# Patient Record
Sex: Male | Born: 1955
Health system: Southern US, Community
[De-identification: ages and names within clinical notes are randomized; demographics above are authoritative.]

## PROBLEM LIST (undated history)

## (undated) DIAGNOSIS — R0609 Other forms of dyspnea: Secondary | ICD-10-CM

## (undated) DIAGNOSIS — Z8619 Personal history of other infectious and parasitic diseases: Secondary | ICD-10-CM

## (undated) DIAGNOSIS — E785 Hyperlipidemia, unspecified: Secondary | ICD-10-CM

## (undated) DIAGNOSIS — R0600 Dyspnea, unspecified: Secondary | ICD-10-CM

## (undated) DIAGNOSIS — L709 Acne, unspecified: Secondary | ICD-10-CM

## (undated) DIAGNOSIS — Z8719 Personal history of other diseases of the digestive system: Secondary | ICD-10-CM

## (undated) DIAGNOSIS — J449 Chronic obstructive pulmonary disease, unspecified: Secondary | ICD-10-CM

## (undated) DIAGNOSIS — K219 Gastro-esophageal reflux disease without esophagitis: Secondary | ICD-10-CM

## (undated) DIAGNOSIS — N401 Enlarged prostate with lower urinary tract symptoms: Secondary | ICD-10-CM

## (undated) DIAGNOSIS — Z87898 Personal history of other specified conditions: Secondary | ICD-10-CM

## (undated) DIAGNOSIS — E119 Type 2 diabetes mellitus without complications: Secondary | ICD-10-CM

## (undated) DIAGNOSIS — M545 Low back pain, unspecified: Secondary | ICD-10-CM

## (undated) DIAGNOSIS — I10 Essential (primary) hypertension: Secondary | ICD-10-CM

## (undated) DIAGNOSIS — M1990 Unspecified osteoarthritis, unspecified site: Secondary | ICD-10-CM

## (undated) DIAGNOSIS — G8929 Other chronic pain: Secondary | ICD-10-CM

## (undated) DIAGNOSIS — N489 Disorder of penis, unspecified: Secondary | ICD-10-CM

## (undated) DIAGNOSIS — Q7649 Other congenital malformations of spine, not associated with scoliosis: Secondary | ICD-10-CM

## (undated) DIAGNOSIS — Z9889 Other specified postprocedural states: Secondary | ICD-10-CM

## (undated) HISTORY — PX: ANKLE SURGERY: SHX546

## (undated) HISTORY — PX: LAPAROSCOPIC CHOLECYSTECTOMY: SUR755

## (undated) HISTORY — PX: ROTATOR CUFF REPAIR: SHX139

## (undated) HISTORY — PX: OTHER SURGICAL HISTORY: SHX169

---

## 1977-02-20 HISTORY — PX: WRIST SURGERY: SHX841

## 1997-06-05 ENCOUNTER — Other Ambulatory Visit: Admission: RE | Admit: 1997-06-05 | Discharge: 1997-06-05 | Payer: Self-pay | Admitting: Gastroenterology

## 1997-09-15 ENCOUNTER — Ambulatory Visit: Admission: RE | Admit: 1997-09-15 | Discharge: 1997-09-15 | Payer: Self-pay | Admitting: Pulmonary Disease

## 1997-09-21 ENCOUNTER — Ambulatory Visit (HOSPITAL_COMMUNITY): Admission: RE | Admit: 1997-09-21 | Discharge: 1997-09-21 | Payer: Self-pay | Admitting: Pulmonary Disease

## 1997-11-23 ENCOUNTER — Ambulatory Visit: Admission: RE | Admit: 1997-11-23 | Discharge: 1997-11-23 | Payer: Self-pay | Admitting: Pulmonary Disease

## 1998-03-09 ENCOUNTER — Ambulatory Visit: Admission: RE | Admit: 1998-03-09 | Discharge: 1998-03-09 | Payer: Self-pay | Admitting: Pulmonary Disease

## 1998-06-24 ENCOUNTER — Ambulatory Visit: Admission: RE | Admit: 1998-06-24 | Discharge: 1998-06-24 | Payer: Self-pay | Admitting: Pulmonary Disease

## 2000-11-09 ENCOUNTER — Ambulatory Visit (HOSPITAL_COMMUNITY): Admission: RE | Admit: 2000-11-09 | Discharge: 2000-11-09 | Payer: Self-pay | Admitting: Gastroenterology

## 2000-11-09 ENCOUNTER — Encounter: Payer: Self-pay | Admitting: Gastroenterology

## 2003-02-21 HISTORY — PX: ANTERIOR CERVICAL DECOMP/DISCECTOMY FUSION: SHX1161

## 2003-11-19 ENCOUNTER — Encounter: Admission: RE | Admit: 2003-11-19 | Discharge: 2003-11-19 | Payer: Self-pay | Admitting: Family Medicine

## 2003-11-30 ENCOUNTER — Ambulatory Visit (HOSPITAL_COMMUNITY): Admission: RE | Admit: 2003-11-30 | Discharge: 2003-12-01 | Payer: Self-pay | Admitting: Neurosurgery

## 2004-09-17 ENCOUNTER — Encounter: Admission: RE | Admit: 2004-09-17 | Discharge: 2004-09-17 | Payer: Self-pay | Admitting: Neurosurgery

## 2004-10-26 ENCOUNTER — Ambulatory Visit (HOSPITAL_COMMUNITY): Admission: RE | Admit: 2004-10-26 | Discharge: 2004-10-26 | Payer: Self-pay | Admitting: Orthopedic Surgery

## 2004-10-26 ENCOUNTER — Ambulatory Visit (HOSPITAL_BASED_OUTPATIENT_CLINIC_OR_DEPARTMENT_OTHER): Admission: RE | Admit: 2004-10-26 | Discharge: 2004-10-26 | Payer: Self-pay | Admitting: Orthopedic Surgery

## 2012-01-24 ENCOUNTER — Other Ambulatory Visit: Payer: Self-pay | Admitting: Family Medicine

## 2012-01-24 DIAGNOSIS — H539 Unspecified visual disturbance: Secondary | ICD-10-CM

## 2012-01-24 DIAGNOSIS — H53122 Transient visual loss, left eye: Secondary | ICD-10-CM

## 2012-01-25 ENCOUNTER — Ambulatory Visit
Admission: RE | Admit: 2012-01-25 | Discharge: 2012-01-25 | Disposition: A | Discharge status: Home / Self Care | Payer: PRIVATE HEALTH INSURANCE | Source: Ambulatory Visit | Attending: Family Medicine | Admitting: Family Medicine

## 2012-01-25 DIAGNOSIS — H53122 Transient visual loss, left eye: Secondary | ICD-10-CM

## 2012-01-25 DIAGNOSIS — H539 Unspecified visual disturbance: Secondary | ICD-10-CM

## 2012-02-26 ENCOUNTER — Other Ambulatory Visit: Payer: Self-pay | Admitting: Family Medicine

## 2012-02-29 ENCOUNTER — Ambulatory Visit
Admission: RE | Admit: 2012-02-29 | Discharge: 2012-02-29 | Disposition: A | Discharge status: Home / Self Care | Payer: 59 | Source: Ambulatory Visit | Attending: Family Medicine | Admitting: Family Medicine

## 2012-02-29 ENCOUNTER — Ambulatory Visit
Admission: RE | Admit: 2012-02-29 | Discharge: 2012-02-29 | Disposition: A | Discharge status: Home / Self Care | Payer: Self-pay | Source: Ambulatory Visit | Attending: Family Medicine | Admitting: Family Medicine

## 2012-02-29 MED ORDER — GADOBENATE DIMEGLUMINE 529 MG/ML IV SOLN
18.0000 mL | Freq: Once | INTRAVENOUS | Status: AC | PRN
  Administered 2012-02-29: 18 mL via INTRAVENOUS

## 2012-03-08 ENCOUNTER — Other Ambulatory Visit (HOSPITAL_COMMUNITY): Payer: Self-pay | Admitting: Interventional Radiology

## 2012-03-08 ENCOUNTER — Ambulatory Visit (HOSPITAL_COMMUNITY)
Admission: RE | Admit: 2012-03-08 | Discharge: 2012-03-08 | Disposition: A | Discharge status: Home / Self Care | Payer: 59 | Source: Ambulatory Visit | Attending: Interventional Radiology | Admitting: Interventional Radiology

## 2012-03-08 DIAGNOSIS — I729 Aneurysm of unspecified site: Secondary | ICD-10-CM

## 2012-03-12 ENCOUNTER — Encounter (HOSPITAL_COMMUNITY): Payer: Self-pay

## 2012-03-12 ENCOUNTER — Ambulatory Visit (HOSPITAL_COMMUNITY)
Admission: RE | Admit: 2012-03-12 | Discharge: 2012-03-12 | Disposition: A | Discharge status: Home / Self Care | Payer: 59 | Source: Ambulatory Visit | Attending: Interventional Radiology | Admitting: Interventional Radiology

## 2012-03-12 ENCOUNTER — Other Ambulatory Visit (HOSPITAL_COMMUNITY): Payer: Self-pay | Admitting: Interventional Radiology

## 2012-03-12 ENCOUNTER — Encounter (HOSPITAL_COMMUNITY): Payer: Self-pay | Admitting: Pharmacy Technician

## 2012-03-12 DIAGNOSIS — I671 Cerebral aneurysm, nonruptured: Secondary | ICD-10-CM | POA: Insufficient documentation

## 2012-03-12 DIAGNOSIS — R51 Headache: Secondary | ICD-10-CM | POA: Insufficient documentation

## 2012-03-12 DIAGNOSIS — I10 Essential (primary) hypertension: Secondary | ICD-10-CM | POA: Insufficient documentation

## 2012-03-12 DIAGNOSIS — R519 Headache, unspecified: Secondary | ICD-10-CM | POA: Insufficient documentation

## 2012-03-12 DIAGNOSIS — I729 Aneurysm of unspecified site: Secondary | ICD-10-CM

## 2012-03-12 DIAGNOSIS — H538 Other visual disturbances: Secondary | ICD-10-CM | POA: Insufficient documentation

## 2012-03-12 HISTORY — DX: Essential (primary) hypertension: I10

## 2012-03-12 LAB — BASIC METABOLIC PANEL
BUN: 20 mg/dL (ref 6–23)
CO2: 25 mEq/L (ref 19–32)
Calcium: 10 mg/dL (ref 8.4–10.5)
Glucose, Bld: 251 mg/dL — ABNORMAL HIGH (ref 70–99)
Potassium: 3.9 mEq/L (ref 3.5–5.1)

## 2012-03-12 LAB — CBC WITH DIFFERENTIAL/PLATELET
Basophils Absolute: 0 10*3/uL (ref 0.0–0.1)
Basophils Relative: 1 % (ref 0–1)
HCT: 45.6 % (ref 39.0–52.0)
Hemoglobin: 16.5 g/dL (ref 13.0–17.0)
Lymphs Abs: 1.5 10*3/uL (ref 0.7–4.0)
MCH: 31.4 pg (ref 26.0–34.0)
MCHC: 36.2 g/dL — ABNORMAL HIGH (ref 30.0–36.0)
MCV: 86.7 fL (ref 78.0–100.0)
Monocytes Absolute: 0.5 10*3/uL (ref 0.1–1.0)
Neutrophils Relative %: 59 % (ref 43–77)
RBC: 5.26 MIL/uL (ref 4.22–5.81)
RDW: 12.1 % (ref 11.5–15.5)
WBC: 5.7 10*3/uL (ref 4.0–10.5)

## 2012-03-12 MED ORDER — FENTANYL CITRATE 0.05 MG/ML IJ SOLN
INTRAMUSCULAR | Status: AC
  Filled 2012-03-12: qty 4

## 2012-03-12 MED ORDER — MIDAZOLAM HCL 2 MG/2ML IJ SOLN
INTRAMUSCULAR | Status: AC | PRN
  Administered 2012-03-12 (×2): 1 mg via INTRAVENOUS

## 2012-03-12 MED ORDER — HEPARIN SOD (PORK) LOCK FLUSH 100 UNIT/ML IV SOLN
INTRAVENOUS | Status: AC | PRN
  Administered 2012-03-12: 1000 [IU] via INTRAVENOUS

## 2012-03-12 MED ORDER — MIDAZOLAM HCL 2 MG/2ML IJ SOLN
INTRAMUSCULAR | Status: AC
  Filled 2012-03-12: qty 4

## 2012-03-12 MED ORDER — IOHEXOL 300 MG/ML  SOLN
150.0000 mL | Freq: Once | INTRAMUSCULAR | Status: AC | PRN
  Administered 2012-03-12: 80 mL via INTRAVENOUS

## 2012-03-12 MED ORDER — FENTANYL CITRATE 0.05 MG/ML IJ SOLN
INTRAMUSCULAR | Status: AC | PRN
  Administered 2012-03-12 (×2): 25 ug via INTRAVENOUS

## 2012-03-12 MED ORDER — SODIUM CHLORIDE 0.9 % IV SOLN
Freq: Once | INTRAVENOUS | Status: AC
  Administered 2012-03-12: 12:00:00 via INTRAVENOUS

## 2012-03-12 NOTE — H&P (Signed)
Tommy Rojas is an 57 y.o. male.   Chief Complaint: headache x 1 mo occas dizziness Recent MRI/MRA reveals possible aneurysm Scheduled now for cerebral arteriogram HPI: HTN  Past Medical History  Diagnosis Date  . Hypertension   . Headache     History reviewed. No pertinent past surgical history.  History reviewed. No pertinent family history. Social History:  reports that he quit smoking about 2 years ago. He does not have any smokeless tobacco history on file. His alcohol and drug histories not on file.  Allergies: No Known Allergies   (Not in a hospital admission)  No results found for this or any previous visit (from the past 48 hour(s)). No results found.  Review of Systems  Constitutional: Negative for fever and weight loss.  HENT: Negative for hearing loss.   Eyes: Negative for blurred vision.  Respiratory: Negative for cough.   Cardiovascular: Negative for chest pain.  Gastrointestinal: Negative for nausea, vomiting and abdominal pain.  Neurological: Positive for dizziness and headaches. Negative for weakness.  Psychiatric/Behavioral: The patient is nervous/anxious.     Blood pressure 135/103, pulse 110, temperature 98.3 F (36.8 C), temperature source Oral, resp. rate 18, height 5\' 11"  (1.803 m), weight 202 lb (91.627 kg), SpO2 96.00%. Physical Exam  Constitutional: He appears well-developed and well-nourished.  Eyes: EOM are normal.  Neck: Normal range of motion.  Cardiovascular: Normal rate, regular rhythm and normal heart sounds.   No murmur heard. Respiratory: Effort normal and breath sounds normal.  GI: Soft. Bowel sounds are normal. There is no tenderness.  Musculoskeletal: Normal range of motion.  Neurological: He is alert.  Skin: Skin is warm.  Psychiatric: He has a normal mood and affect. His behavior is normal. Judgment and thought content normal.     Assessment/Plan HA x 1 mo Abn MRI/MRA Pt scheduled for cerebral arteriogram Pt aware of  procedure benefits and risks and agreeable to proceed Consent signed and in chart  Tommy Rojas A 03/12/2012, 12:06 PM

## 2012-03-12 NOTE — Procedures (Signed)
S/P 4 vessel cerebral arteriogram Rt CFA approach  Findings  1.Appro 3.4 mm x 2.23mm RT MCA aneurysm prox to the trifurcation.

## 2012-03-13 ENCOUNTER — Other Ambulatory Visit (HOSPITAL_COMMUNITY): Payer: Self-pay | Admitting: Interventional Radiology

## 2012-03-13 DIAGNOSIS — I729 Aneurysm of unspecified site: Secondary | ICD-10-CM

## 2012-03-21 ENCOUNTER — Other Ambulatory Visit (HOSPITAL_COMMUNITY): Payer: Self-pay | Admitting: Interventional Radiology

## 2012-03-21 ENCOUNTER — Ambulatory Visit (HOSPITAL_COMMUNITY)
Admission: RE | Admit: 2012-03-21 | Discharge: 2012-03-21 | Disposition: A | Discharge status: Home / Self Care | Payer: 59 | Source: Ambulatory Visit | Attending: Interventional Radiology | Admitting: Interventional Radiology

## 2012-03-21 DIAGNOSIS — I729 Aneurysm of unspecified site: Secondary | ICD-10-CM

## 2012-03-29 NOTE — Pre-Procedure Instructions (Signed)
Ellie Spickler  03/29/2012   Your procedure is scheduled on:  Monday April 08, 2012.  Report to [facility] at 6:00 AM.  Call this number if you have problems the morning of surgery: [phone]   Remember:   Do not eat food or drink liquids after midnight.   Take these medicines the morning of surgery with A SIP OF WATER: Hydrocodone if needed for pain, Omeprazole (Prilosec)   Do not wear jewelry  Do not wear lotions or colognes.   Men may shave face and neck.  Do not bring valuables to the hospital.  Contacts, dentures or bridgework may not be worn into surgery.  Leave suitcase in the car. After surgery it may be brought to your room.  For patients admitted to the hospital, checkout time is 11:00 AM the day of discharge.   Patients discharged the day of surgery will not be allowed to drive home.  Name and phone number of your driver:   Special Instructions: Shower using CHG 2 nights before surgery and the night before surgery.  If you shower the day of surgery use CHG.  Use special wash - you have one bottle of CHG for all showers.  You should use approximately 1/3 of the bottle for each shower.   Please read over the following fact sheets that you were given: Pain Booklet, Coughing and Deep Breathing, MRSA Information and Surgical Site Infection Prevention

## 2012-04-01 ENCOUNTER — Encounter (HOSPITAL_COMMUNITY)
Admission: RE | Admit: 2012-04-01 | Discharge: 2012-04-01 | Disposition: A | Discharge status: Home / Self Care | Payer: 59 | Source: Ambulatory Visit | Attending: Interventional Radiology | Admitting: Interventional Radiology

## 2012-04-01 ENCOUNTER — Encounter (HOSPITAL_COMMUNITY): Payer: Self-pay

## 2012-04-01 ENCOUNTER — Other Ambulatory Visit: Payer: Self-pay | Admitting: Radiology

## 2012-04-01 HISTORY — DX: Gastro-esophageal reflux disease without esophagitis: K21.9

## 2012-04-01 LAB — CBC
MCH: 31.2 pg (ref 26.0–34.0)
Platelets: 154 10*3/uL (ref 150–400)
RBC: 5.7 MIL/uL (ref 4.22–5.81)

## 2012-04-01 LAB — BASIC METABOLIC PANEL
Calcium: 9.6 mg/dL (ref 8.4–10.5)
GFR calc non Af Amer: 72 mL/min — ABNORMAL LOW (ref >90)
Sodium: 132 mEq/L — ABNORMAL LOW (ref 135–145)

## 2012-04-01 NOTE — Progress Notes (Signed)
This patient has screened at an elevated risk for obstructive sleep apnea using the STOP Bang took during a pre-surgical visit. A score of 4 or greater is an elevated risk of sleep apnea.

## 2012-04-02 ENCOUNTER — Encounter (HOSPITAL_COMMUNITY): Payer: Self-pay | Admitting: Vascular Surgery

## 2012-04-02 NOTE — Consult Note (Signed)
Anesthesia Chart Review:  Patient is a 57 year old male scheduled for right MCA aneurysm embolization by Dr. Corliss Skains on 04/08/12.  History includes former smoker, chronic DOE, HTN, pancreatitis, hepatitis C s/p interferon treatment, GERD, cervical fusion.  His OSA screening score was 4 or greater.  PCP is Dr. Catha Gosselin at [facility].  CXR on 04/01/12 showed no evidence of acute cardiopulmonary disease.  EKG on 04/01/12 showed NSR.  Reportedly, he had a prior stress ordered by his PCP Dr. Clarene Duke, but records are still pending.   Pre-operative labs showed a non-fasting glucose of 354.  He has an unspecified history of pancreatitis, but no diagnosis of diabetes.  I have called and left a message for patient to call me.  I did speak with IR PA Jeananne Rama regarding lab results recommending that if in fact this is new then would recommend further discussion with his PCP on treatment options for hyperglycemia nd whether this would affect timing of his procedure.  He will discuss further with Dr. Corliss Skains and contact Dr. Clarene Duke as indicated.  Of note, there were no physician orders at patient's PAT appointment, so he will need additional labs prior to his procedure.    Shonna Chock, PA-C 04/02/12 1626

## 2012-04-05 NOTE — Consult Note (Signed)
Anesthesia follow-up:  See my note from 04/02/12.  Patient never returned my call, but Jeananne Rama, PA-C spoke with patient and instructed him to see his PCP Dr. Clarene Duke for recommendations for probable new onset DM2.  Dr. Fredirick Maudlin office was closed yesterday and today due to inclement weather, but one of our PAT nurses spoke with patient this morning.  Patient reported that he was seen by his PCP earlier this week and started on medication.  His last glucose was 199.  His stress test was approximately two years ago.  Due to the closing of his PCP office, we have been unable to get a copy of the most recent office note and stress test from a few years ago.  His EKG was unremarkable, and reportedly patient was seen by his PCP this week for hyperglycemia control prior to this procedure.  If his glucose is reasonable and no significant change in his status then would anticipate he could proceed as planned.  Shonna Chock, PA-C 04/05/12 1206

## 2012-04-08 ENCOUNTER — Encounter (HOSPITAL_COMMUNITY): Payer: Self-pay

## 2012-04-08 ENCOUNTER — Ambulatory Visit (HOSPITAL_COMMUNITY)
Admission: RE | Admit: 2012-04-08 | Discharge: 2012-04-08 | Disposition: A | Discharge status: Home / Self Care | Payer: 59 | Source: Ambulatory Visit | Attending: Interventional Radiology | Admitting: Interventional Radiology

## 2012-04-08 ENCOUNTER — Encounter (HOSPITAL_COMMUNITY): Payer: Self-pay | Admitting: Certified Registered Nurse Anesthetist

## 2012-04-08 ENCOUNTER — Ambulatory Visit (HOSPITAL_COMMUNITY): Payer: 59 | Admitting: Vascular Surgery

## 2012-04-08 ENCOUNTER — Encounter (HOSPITAL_COMMUNITY): Payer: Self-pay | Admitting: Vascular Surgery

## 2012-04-08 ENCOUNTER — Encounter (HOSPITAL_COMMUNITY)
Admission: RE | Disposition: A | Discharge status: Home / Self Care | Payer: Self-pay | Source: Ambulatory Visit | Attending: Interventional Radiology

## 2012-04-08 ENCOUNTER — Encounter (HOSPITAL_COMMUNITY): Payer: Self-pay | Admitting: *Deleted

## 2012-04-08 ENCOUNTER — Inpatient Hospital Stay (HOSPITAL_COMMUNITY)
Admission: RE | Admit: 2012-04-08 | Discharge: 2012-04-09 | DRG: 027 | Disposition: A | Discharge status: Home / Self Care | Payer: 59 | Source: Ambulatory Visit | Attending: Interventional Radiology | Admitting: Interventional Radiology

## 2012-04-08 DIAGNOSIS — G4733 Obstructive sleep apnea (adult) (pediatric): Secondary | ICD-10-CM | POA: Diagnosis present

## 2012-04-08 DIAGNOSIS — R51 Headache: Secondary | ICD-10-CM

## 2012-04-08 DIAGNOSIS — Z981 Arthrodesis status: Secondary | ICD-10-CM

## 2012-04-08 DIAGNOSIS — E119 Type 2 diabetes mellitus without complications: Secondary | ICD-10-CM | POA: Diagnosis present

## 2012-04-08 DIAGNOSIS — R0989 Other specified symptoms and signs involving the circulatory and respiratory systems: Secondary | ICD-10-CM | POA: Diagnosis present

## 2012-04-08 DIAGNOSIS — I729 Aneurysm of unspecified site: Secondary | ICD-10-CM

## 2012-04-08 DIAGNOSIS — Z23 Encounter for immunization: Secondary | ICD-10-CM

## 2012-04-08 DIAGNOSIS — R0609 Other forms of dyspnea: Secondary | ICD-10-CM | POA: Diagnosis present

## 2012-04-08 DIAGNOSIS — B1920 Unspecified viral hepatitis C without hepatic coma: Secondary | ICD-10-CM | POA: Diagnosis present

## 2012-04-08 DIAGNOSIS — K219 Gastro-esophageal reflux disease without esophagitis: Secondary | ICD-10-CM | POA: Diagnosis present

## 2012-04-08 DIAGNOSIS — I671 Cerebral aneurysm, nonruptured: Principal | ICD-10-CM | POA: Diagnosis present

## 2012-04-08 DIAGNOSIS — I10 Essential (primary) hypertension: Secondary | ICD-10-CM | POA: Diagnosis present

## 2012-04-08 DIAGNOSIS — Z87891 Personal history of nicotine dependence: Secondary | ICD-10-CM

## 2012-04-08 HISTORY — PX: RADIOLOGY WITH ANESTHESIA: SHX6223

## 2012-04-08 LAB — HEPATIC FUNCTION PANEL
AST: 26 U/L (ref 0–37)
Bilirubin, Direct: 0.1 mg/dL (ref 0.0–0.3)
Indirect Bilirubin: 0.5 mg/dL (ref 0.3–0.9)

## 2012-04-08 LAB — GLUCOSE, CAPILLARY
Glucose-Capillary: 195 mg/dL — ABNORMAL HIGH (ref 70–99)
Glucose-Capillary: 197 mg/dL — ABNORMAL HIGH (ref 70–99)
Glucose-Capillary: 146 mg/dL — ABNORMAL HIGH (ref 70–99)
Glucose-Capillary: 132 mg/dL — ABNORMAL HIGH (ref 70–99)
Glucose-Capillary: 90 mg/dL (ref 70–99)
Glucose-Capillary: 133 mg/dL — ABNORMAL HIGH (ref 70–99)

## 2012-04-08 LAB — MRSA PCR SCREENING: MRSA by PCR: NEGATIVE

## 2012-04-08 LAB — PROTIME-INR: Prothrombin Time: 13.3 seconds (ref 11.6–15.2)

## 2012-04-08 LAB — PLATELET INHIBITION P2Y12: Platelet Function  P2Y12: 136 [PRU] — ABNORMAL LOW (ref 194–418)

## 2012-04-08 LAB — HEPARIN LEVEL (UNFRACTIONATED): Heparin Unfractionated: 0.14 IU/mL — ABNORMAL LOW (ref 0.30–0.70)

## 2012-04-08 SURGERY — RADIOLOGY WITH ANESTHESIA
Anesthesia: General

## 2012-04-08 MED ORDER — ONDANSETRON HCL 4 MG/2ML IJ SOLN
INTRAMUSCULAR | Status: DC | PRN
  Administered 2012-04-08: 4 mg via INTRAVENOUS

## 2012-04-08 MED ORDER — ASPIRIN EC 325 MG PO TBEC
325.0000 mg | DELAYED_RELEASE_TABLET | ORAL | Status: AC
  Administered 2012-04-08: 325 mg via ORAL
  Filled 2012-04-08: qty 1

## 2012-04-08 MED ORDER — NIMODIPINE 30 MG PO CAPS
60.0000 mg | ORAL_CAPSULE | ORAL | Status: AC
  Administered 2012-04-08: 60 mg via ORAL
  Filled 2012-04-08: qty 2

## 2012-04-08 MED ORDER — KETOROLAC TROMETHAMINE 30 MG/ML IJ SOLN
30.0000 mg | Freq: Four times a day (QID) | INTRAMUSCULAR | Status: DC | PRN
  Administered 2012-04-08: 30 mg via INTRAVENOUS
  Filled 2012-04-08: qty 1

## 2012-04-08 MED ORDER — ACETAMINOPHEN 650 MG RE SUPP: 650.0000 mg | Freq: Four times a day (QID) | RECTAL | Status: DC | PRN

## 2012-04-08 MED ORDER — NICARDIPINE HCL IN NACL 20-0.86 MG/200ML-% IV SOLN: 5.0000 mg/h | INTRAVENOUS | Status: DC

## 2012-04-08 MED ORDER — PNEUMOCOCCAL VAC POLYVALENT 25 MCG/0.5ML IJ INJ
0.5000 mL | INJECTION | INTRAMUSCULAR | Status: AC
  Administered 2012-04-09: 0.5 mL via INTRAMUSCULAR
  Filled 2012-04-08: qty 0.5

## 2012-04-08 MED ORDER — PROTAMINE SULFATE 10 MG/ML IV SOLN
INTRAVENOUS | Status: DC | PRN
  Administered 2012-04-08: 7.5 mg via INTRAVENOUS
  Administered 2012-04-08: 2.5 mg via INTRAVENOUS

## 2012-04-08 MED ORDER — CLOPIDOGREL BISULFATE 75 MG PO TABS
75.0000 mg | ORAL_TABLET | ORAL | Status: AC
  Administered 2012-04-08: 75 mg via ORAL
  Filled 2012-04-08: qty 1

## 2012-04-08 MED ORDER — NIMODIPINE 30 MG PO CAPS
ORAL_CAPSULE | ORAL | Status: AC
  Administered 2012-04-08: 60 mg via ORAL
  Filled 2012-04-08: qty 2

## 2012-04-08 MED ORDER — HEPARIN (PORCINE) IN NACL 100-0.45 UNIT/ML-% IJ SOLN
800.0000 [IU]/h | INTRAMUSCULAR | Status: AC
  Administered 2012-04-08: 800 [IU]/h via INTRAVENOUS
  Filled 2012-04-08: qty 250

## 2012-04-08 MED ORDER — ASPIRIN 325 MG PO TABS
325.0000 mg | ORAL_TABLET | Freq: Every day | ORAL | Status: DC
  Administered 2012-04-09: 325 mg via ORAL
  Filled 2012-04-08 (×2): qty 1

## 2012-04-08 MED ORDER — LIDOCAINE HCL 4 % MT SOLN
OROMUCOSAL | Status: DC | PRN
  Administered 2012-04-08: 4 mL via TOPICAL

## 2012-04-08 MED ORDER — CEFAZOLIN SODIUM-DEXTROSE 2-3 GM-% IV SOLR
INTRAVENOUS | Status: AC
  Filled 2012-04-08: qty 50

## 2012-04-08 MED ORDER — HEPARIN (PORCINE) IN NACL 100-0.45 UNIT/ML-% IJ SOLN
800.0000 [IU]/h | INTRAMUSCULAR | Status: AC
  Filled 2012-04-08: qty 250

## 2012-04-08 MED ORDER — KETOROLAC TROMETHAMINE 30 MG/ML IJ SOLN
INTRAMUSCULAR | Status: AC
  Filled 2012-04-08: qty 1

## 2012-04-08 MED ORDER — ACETAMINOPHEN 500 MG PO TABS
1000.0000 mg | ORAL_TABLET | Freq: Four times a day (QID) | ORAL | Status: DC | PRN
  Administered 2012-04-08: 1000 mg via ORAL
  Filled 2012-04-08: qty 2

## 2012-04-08 MED ORDER — ASPIRIN EC 325 MG PO TBEC
DELAYED_RELEASE_TABLET | ORAL | Status: AC
  Administered 2012-04-08: 325 mg via ORAL
  Filled 2012-04-08: qty 1

## 2012-04-08 MED ORDER — HEPARIN SODIUM (PORCINE) 1000 UNIT/ML IJ SOLN
INTRAMUSCULAR | Status: DC | PRN
  Administered 2012-04-08: 3 mL via INTRAVENOUS

## 2012-04-08 MED ORDER — INSULIN ASPART 100 UNIT/ML ~~LOC~~ SOLN
0.0000 [IU] | Freq: Three times a day (TID) | SUBCUTANEOUS | Status: DC
  Administered 2012-04-08: 1 [IU] via SUBCUTANEOUS

## 2012-04-08 MED ORDER — CEFAZOLIN SODIUM-DEXTROSE 2-3 GM-% IV SOLR
2.0000 g | Freq: Once | INTRAVENOUS | Status: AC
  Administered 2012-04-08: 2 g via INTRAVENOUS
  Filled 2012-04-08: qty 50

## 2012-04-08 MED ORDER — INFLUENZA VIRUS VACC SPLIT PF IM SUSP
0.5000 mL | INTRAMUSCULAR | Status: AC
  Administered 2012-04-09: 0.5 mL via INTRAMUSCULAR
  Filled 2012-04-08: qty 0.5

## 2012-04-08 MED ORDER — GLYCOPYRROLATE 0.2 MG/ML IJ SOLN
INTRAMUSCULAR | Status: DC | PRN
  Administered 2012-04-08: .6 mg via INTRAVENOUS

## 2012-04-08 MED ORDER — VECURONIUM BROMIDE 10 MG IV SOLR
INTRAVENOUS | Status: DC | PRN
  Administered 2012-04-08: 2 mg via INTRAVENOUS
  Administered 2012-04-08: 1 mg via INTRAVENOUS
  Administered 2012-04-08 (×2): 2 mg via INTRAVENOUS

## 2012-04-08 MED ORDER — SODIUM CHLORIDE 0.9 % IV SOLN
INTRAVENOUS | Status: DC
  Administered 2012-04-08: 35 mL/h via INTRAVENOUS

## 2012-04-08 MED ORDER — NITROGLYCERIN 1 MG/10 ML FOR IR/CATH LAB
INTRA_ARTERIAL | Status: AC
  Filled 2012-04-08: qty 10

## 2012-04-08 MED ORDER — LIDOCAINE HCL (CARDIAC) 20 MG/ML IV SOLN
INTRAVENOUS | Status: DC | PRN
  Administered 2012-04-08: 100 mg via INTRAVENOUS

## 2012-04-08 MED ORDER — KETOROLAC TROMETHAMINE 30 MG/ML IJ SOLN
30.0000 mg | Freq: Four times a day (QID) | INTRAMUSCULAR | Status: DC
  Administered 2012-04-08: 30 mg via INTRAVENOUS

## 2012-04-08 MED ORDER — LABETALOL HCL 5 MG/ML IV SOLN
INTRAVENOUS | Status: DC | PRN
  Administered 2012-04-08: 5 mg via INTRAVENOUS

## 2012-04-08 MED ORDER — ACETAMINOPHEN 500 MG PO TABS: 1000.0000 mg | ORAL_TABLET | Freq: Four times a day (QID) | ORAL | Status: DC | PRN

## 2012-04-08 MED ORDER — CLOPIDOGREL BISULFATE 75 MG PO TABS
ORAL_TABLET | ORAL | Status: AC
  Administered 2012-04-08: 75 mg via ORAL
  Filled 2012-04-08: qty 1

## 2012-04-08 MED ORDER — ONDANSETRON HCL 4 MG/2ML IJ SOLN: 4.0000 mg | Freq: Four times a day (QID) | INTRAMUSCULAR | Status: DC | PRN

## 2012-04-08 MED ORDER — ROCURONIUM BROMIDE 100 MG/10ML IV SOLN
INTRAVENOUS | Status: DC | PRN
  Administered 2012-04-08: 50 mg via INTRAVENOUS

## 2012-04-08 MED ORDER — SODIUM CHLORIDE 0.9 % IV SOLN
Freq: Once | INTRAVENOUS | Status: AC
  Administered 2012-04-08 (×2): via INTRAVENOUS

## 2012-04-08 MED ORDER — HYDROCODONE-ACETAMINOPHEN 5-325 MG PO TABS
1.0000 | ORAL_TABLET | Freq: Four times a day (QID) | ORAL | Status: DC | PRN
  Administered 2012-04-08 – 2012-04-09 (×2): 1 via ORAL
  Filled 2012-04-08 (×2): qty 1

## 2012-04-08 MED ORDER — PROPOFOL 10 MG/ML IV BOLUS
INTRAVENOUS | Status: DC | PRN
  Administered 2012-04-08: 200 mg via INTRAVENOUS

## 2012-04-08 MED ORDER — FENTANYL CITRATE 0.05 MG/ML IJ SOLN
INTRAMUSCULAR | Status: DC | PRN
  Administered 2012-04-08: 100 ug via INTRAVENOUS

## 2012-04-08 MED ORDER — IOHEXOL 300 MG/ML  SOLN
150.0000 mL | Freq: Once | INTRAMUSCULAR | Status: AC | PRN
  Administered 2012-04-08: 100 mL via INTRA_ARTERIAL

## 2012-04-08 MED ORDER — SODIUM CHLORIDE 0.9 % IV SOLN
INTRAVENOUS | Status: DC
  Administered 2012-04-09: via INTRAVENOUS

## 2012-04-08 MED ORDER — NEOSTIGMINE METHYLSULFATE 1 MG/ML IJ SOLN
INTRAMUSCULAR | Status: DC | PRN
  Administered 2012-04-08: 4 mg via INTRAVENOUS

## 2012-04-08 NOTE — H&P (Signed)
Tommy Rojas is an 57 y.o. male.   Chief Complaint: headaches x 2 months Cerebral arteriogram 03/12/12 reveals Right Middle Cerebral Artery Aneurysm Pt now scheduled for possible embolization HPI: HTN; H/As; R MCA aneurysm; Pancreatitis; Hep C  Past Medical History  Diagnosis Date  . Hypertension   . Headache   . Bronchitis   . Shortness of breath     with exertion  . Pancreatitis   . Diverticulitis   . Urgency of urination   . Hepatitis     Hx of Hep C  . Rash     back of head uses Minocycline PRN  . GERD (gastroesophageal reflux disease)     Past Surgical History  Procedure Laterality Date  . Cholecystectomy    . Cervical fusion    . Wrist surgery      right  . Ankle surgery      left  . Rotator cuff repair      right    No family history on file. Social History:  reports that he quit smoking about 2 years ago. He does not have any smokeless tobacco history on file. He reports that he drinks about 3.6 ounces of alcohol per week. He reports that he does not use illicit drugs.  Allergies: No Known Allergies   (Not in Rojas hospital admission)  Results for orders placed during the hospital encounter of 04/08/12 (from the past 48 hour(s))  GLUCOSE, CAPILLARY     Status: Abnormal   Collection Time    04/08/12  6:41 AM      Result Value Range   Glucose-Capillary 195 (*) 70 - 99 mg/dL   No results found.  Review of Systems  Constitutional: Negative for fever and chills.  HENT: Negative for hearing loss.   Eyes: Negative for blurred vision.  Respiratory: Negative for cough and shortness of breath.   Gastrointestinal: Negative for nausea, vomiting and abdominal pain.  Neurological: Positive for headaches. Negative for dizziness and weakness.    There were no vitals taken for this visit. Physical Exam  Constitutional: He is oriented to person, place, and time. He appears well-developed and well-nourished.  Eyes: EOM are normal.  Neck: Normal range of motion.   Cardiovascular: Normal rate, regular rhythm and normal heart sounds.   No murmur heard. Respiratory: Effort normal and breath sounds normal. He has no wheezes.  GI: Soft. Bowel sounds are normal. There is no tenderness.  Musculoskeletal: Normal range of motion. He exhibits no edema and no tenderness.  Neurological: He is alert and oriented to person, place, and time. No cranial nerve deficit. Coordination normal.  Skin: Skin is warm and dry.  Psychiatric: He has Rojas normal mood and affect. His behavior is normal. Judgment and thought content normal.     Assessment/Plan Pt has been suffering headaches x 2 months Evaluation included cer arteriogram performed 03/12/12 This reveals R MCA aneurysm which may not contribute to his sxs Consulted with Dr Corliss Skains for discussion of options Now scheduled for Cerebral arteriogram with possible embolization of R MCA aneurysm Pt aware of procedure benefits and risks and agreeable to proceed Consent signed and in chart Pt aware if intervention performed he will spend night in neuro ICU dc'd in am   Tommy Rojas 04/08/2012, 7:48 AM

## 2012-04-08 NOTE — Progress Notes (Signed)
Subjective: Post procedure assessment. Pt feeling ok. Does c/o central frontal headache, mild-moderate, somewhat better after Toradol. Denies N/V, has had some ice chips.  Objective: Physical Exam: BP 121/78  Pulse 83  Temp(Src) 97.6 F (36.4 C) (Oral)  Resp 15  Ht 5\' 11"  (1.803 m)  Wt 198 lb 10.2 oz (90.1 kg)  BMI 27.72 kg/m2  SpO2 94% Gen: NAD Neuro: Awake and alert, nl mentation EOMI, PERRL Tongue midline, no facial droop or asymmetry Motor: Nl grip strength Nl Finger to nose No UE drift  Rt groin soft, no hematoma, dressing dry Legs warm, 2+ pedal pulses   Labs: CBC No results found for this basename: WBC, HGB, HCT, PLT,  in the last 72 hours BMET No results found for this basename: NA, K, CL, CO2, GLUCOSE, BUN, CREATININE, CALCIUM,  in the last 72 hours LFT  Recent Labs  04/08/12 0650  PROT 7.0  ALBUMIN 3.9  AST 26  ALT 42  ALKPHOS 97  BILITOT 0.6  BILIDIR 0.1  IBILI 0.5   PT/INR  Recent Labs  04/08/12 0650  LABPROT 13.3  INR 1.02     Studies/Results: No results found.  Assessment/Plan: S/P rt common carotid arteriogram followed by coiling of Rt MCA aneurysm Doing well. Reviewed Bedrest and Straight leg times with RN If stable, will plan for discharge in am.    LOS: 0 days    Brayton El PA-C 04/08/2012 2:20 PM

## 2012-04-08 NOTE — Progress Notes (Signed)
ANTICOAGULATION CONSULT NOTE - Follow Up Consult  Pharmacy Consult for Heparin Indication: s/p coiling of aneurysm  No Known Allergies  Patient Measurements: Height: 5\' 11"  (180.3 cm) Weight: 198 lb 10.2 oz (90.1 kg) IBW/kg (Calculated) : 75.3  Vital Signs: Temp: 98.8 F (37.1 C) (02/17 2000) Temp src: Oral (02/17 2000) BP: 129/67 mmHg (02/17 2100) Pulse Rate: 86 (02/17 2100)  Labs:  Recent Labs  04/08/12 0650 04/08/12 2034  APTT 28  --   LABPROT 13.3  --   INR 1.02  --   HEPARINUNFRC  --  0.14*    Estimated Creatinine Clearance: 79.1 ml/min (by C-G formula based on Cr of 1.11).   Medications:  Infusions:  . sodium chloride 35 mL/hr (04/08/12 0821)  . sodium chloride 75 mL/hr at 04/08/12 1245  . heparin 800 Units/hr (04/08/12 1300)  . niCARDipine    . [DISCONTINUED] niCARDipine      Assessment: 57 y/o male on heparin s/p coiling of MCA aneurysm. Heparin level is therapeutic at 0.14. No bleeding is noted.  Goal of Therapy:  Heparin level 0.1-0.25 units/ml Monitor platelets by anticoagulation protocol: Yes   Plan:  -Continue heparin drip at 800 units/hr until 2/18 07:00 -Monitor for signs/symptoms of bleeding  [facility], Mayagi¼ez.D., BCPS Clinical Pharmacist Pager: [phone] 04/08/2012 9:33 PM

## 2012-04-08 NOTE — Progress Notes (Signed)
Right groin dressing clean, dry intact on arrival to PACU. No drainage noted

## 2012-04-08 NOTE — Transfer of Care (Signed)
Immediate Anesthesia Transfer of Care Note  Patient: Tommy Rojas  Procedure(s) Performed: Procedure(s): RADIOLOGY WITH ANESTHESIA (N/A)  Patient Location: PACU  Anesthesia Type:General  Level of Consciousness: awake, alert , oriented and patient cooperative  Airway & Oxygen Therapy: Patient Spontanous Breathing and Patient connected to nasal cannula oxygen  Post-op Assessment: Report given to PACU RN, Post -op Vital signs reviewed and stable, Patient moving all extremities X 4 and Patient able to stick tongue midline  Post vital signs: Reviewed and stable  Complications: No apparent anesthesia complications

## 2012-04-08 NOTE — Anesthesia Procedure Notes (Signed)
Procedure Name: Intubation Date/Time: 04/08/2012 9:06 AM Performed by: Rogelia Boga Pre-anesthesia Checklist: Patient identified, Emergency Drugs available, Suction available, Patient being monitored and Timeout performed Patient Re-evaluated:Patient Re-evaluated prior to inductionOxygen Delivery Method: Circle system utilized Intubation Type: IV induction Ventilation: Mask ventilation without difficulty and Oral airway inserted - appropriate to patient size Laryngoscope Size: Mac and 4 Grade View: Grade II Tube type: Oral Tube size: 7.5 mm Number of attempts: 1 Airway Equipment and Method: Stylet and LTA kit utilized Placement Confirmation: ETT inserted through vocal cords under direct vision,  positive ETCO2 and breath sounds checked- equal and bilateral Secured at: 21 cm Tube secured with: Tape Dental Injury: Teeth and Oropharynx as per pre-operative assessment

## 2012-04-08 NOTE — Procedures (Signed)
S/P rt common carotid arteriogram followed by coiling of Rt MCA aneurysm

## 2012-04-08 NOTE — Progress Notes (Signed)
UR completed 

## 2012-04-08 NOTE — Progress Notes (Addendum)
ANTICOAGULATION CONSULT NOTE - Initial Consult  Pharmacy Consult for Heparin Indication: Post coiling of aneurysm - Dr. Corliss Skains  No Known Allergies  Patient Measurements: Weight = 88 kg  Vital Signs: Temp: 97.9 F (36.6 C) (02/17 0636) Temp src: Oral (02/17 0636) BP: 146/86 mmHg (02/17 0636) Pulse Rate: 82 (02/17 0636)  Labs:  Recent Labs  04/08/12 0650  APTT 28  LABPROT 13.3  INR 1.02    The CrCl is unknown because both a height and weight (above a minimum accepted value) are required for this calculation.   Medical History: Past Medical History  Diagnosis Date  . Hypertension   . Headache   . Bronchitis   . Shortness of breath     with exertion  . Pancreatitis   . Diverticulitis   . Urgency of urination   . Hepatitis     Hx of Hep C  . Rash     back of head uses Minocycline PRN  . GERD (gastroesophageal reflux disease)     Assessment: 57 year old beginning heparin s/p embolization of cerebral artery aneurysm.  To stop in AM  Goal of Therapy:  Heparin level 0.1 - 0.2 units/ml Monitor platelets by anticoagulation protocol: Yes   Plan:  1) Begin heparin drip at 800 units / hr 2) Heparin level 8 hours after heparin begins 3) Daily heparin level / CBC  Thank you. Okey Regal, PharmD [phone]  04/08/2012,11:48 AM

## 2012-04-08 NOTE — Anesthesia Preprocedure Evaluation (Addendum)
Anesthesia Evaluation  Patient identified by MRN, date of birth, ID band Patient awake    Reviewed: Allergy & Precautions, H&P , NPO status , Patient's Chart, lab work & pertinent test results  History of Anesthesia Complications Negative for: history of anesthetic complications  Airway Mallampati: II TM Distance: >3 FB Neck ROM: Full    Dental  (+) Teeth Intact, Implants and Dental Advisory Given   Pulmonary shortness of breath and with exertion, former smoker,    Pulmonary exam normal       Cardiovascular hypertension, Pt. on medications     Neuro/Psych  Headaches, negative psych ROS   GI/Hepatic GERD-  Medicated and Controlled,(+) Hepatitis -, C  Endo/Other  negative endocrine ROS  Renal/GU negative Renal ROS     Musculoskeletal   Abdominal   Peds  Hematology   Anesthesia Other Findings   Reproductive/Obstetrics                         Anesthesia Physical Anesthesia Plan  ASA: III  Anesthesia Plan: General   Post-op Pain Management:    Induction: Intravenous  Airway Management Planned: Oral ETT and Mask  Additional Equipment:   Intra-op Plan:   Post-operative Plan: Possible Post-op intubation/ventilation  Informed Consent: I have reviewed the patients History and Physical, chart, labs and discussed the procedure including the risks, benefits and alternatives for the proposed anesthesia with the patient or authorized representative who has indicated his/her understanding and acceptance.   Dental advisory given  Plan Discussed with: CRNA, Anesthesiologist and Surgeon  Anesthesia Plan Comments:        Anesthesia Quick Evaluation

## 2012-04-08 NOTE — Preoperative (Signed)
Beta Blockers   Reason not to administer Beta Blockers:Not Applicable 

## 2012-04-09 LAB — CBC WITH DIFFERENTIAL/PLATELET
Hemoglobin: 13.7 g/dL (ref 13.0–17.0)
Lymphocytes Relative: 33 % (ref 12–46)
Lymphs Abs: 1.4 10*3/uL (ref 0.7–4.0)
Monocytes Relative: 8 % (ref 3–12)
Neutro Abs: 2.3 10*3/uL (ref 1.7–7.7)
Neutrophils Relative %: 54 % (ref 43–77)
RBC: 4.34 MIL/uL (ref 4.22–5.81)
WBC: 4.2 10*3/uL (ref 4.0–10.5)

## 2012-04-09 LAB — HEPARIN LEVEL (UNFRACTIONATED): Heparin Unfractionated: 0.15 IU/mL — ABNORMAL LOW (ref 0.30–0.70)

## 2012-04-09 LAB — BASIC METABOLIC PANEL
GFR calc Af Amer: 81 mL/min — ABNORMAL LOW (ref >90)
GFR calc non Af Amer: 70 mL/min — ABNORMAL LOW (ref >90)
Glucose, Bld: 122 mg/dL — ABNORMAL HIGH (ref 70–99)
Potassium: 3.8 mEq/L (ref 3.5–5.1)
Sodium: 139 mEq/L (ref 135–145)

## 2012-04-09 LAB — GLUCOSE, CAPILLARY
Glucose-Capillary: 109 mg/dL — ABNORMAL HIGH (ref 70–99)
Glucose-Capillary: 119 mg/dL — ABNORMAL HIGH (ref 70–99)

## 2012-04-09 MED ORDER — CLOPIDOGREL BISULFATE 75 MG PO TABS
75.0000 mg | ORAL_TABLET | Freq: Once | ORAL | Status: AC
  Administered 2012-04-09: 75 mg via ORAL
  Filled 2012-04-09: qty 1

## 2012-04-09 NOTE — Progress Notes (Signed)
D/c'd the patients left radial A-line and foley cath per verbal order from Dr. Corliss Skains. Will continue to monitor.

## 2012-04-09 NOTE — Anesthesia Postprocedure Evaluation (Signed)
Anesthesia Post Note  Patient: Tommy Rojas  Procedure(s) Performed: Procedure(s) (LRB): RADIOLOGY WITH ANESTHESIA (N/A)  Anesthesia type: general  Patient location: PACU  Post pain: Pain level controlled  Post assessment: Patient's Cardiovascular Status Stable  Last Vitals:  Filed Vitals:   04/09/12 1100  BP: 127/73  Pulse: 77  Temp:   Resp: 14    Post vital signs: Reviewed and stable  Level of consciousness: sedated  Complications: No apparent anesthesia complications

## 2012-04-09 NOTE — Progress Notes (Signed)
Patient being discharged to home with wife. D/c'D pts IVs. Reviewed discharge instruction with patient and wife; answered all questions and patient verbalized understanding.

## 2012-04-09 NOTE — Progress Notes (Signed)
ANTICOAGULATION CONSULT NOTE - Follow Up Consult  Pharmacy Consult for heparin Indication: s/p coling of aneurysm   Labs:  Recent Labs  04/08/12 0650 04/08/12 2034 04/09/12 0500  APTT 28  --   --   LABPROT 13.3  --   --   INR 1.02  --   --   HEPARINUNFRC  --  0.14* 0.15*    Assessment/Plan:  57yo male remains therapeutic on heparin with plan to discontinue gtt at 0700.  Colleen Can PharmD BCPS 04/09/2012,5:57 AM

## 2012-04-09 NOTE — Discharge Summary (Signed)
Physician Discharge Summary  Patient ID: Tommy Rojas MRN: [id number] DOB/AGE: 03/04/1955 57 y.o.  Admit date: 04/08/2012 Discharge date: 04/09/2012  Admission Diagnoses: Headaches; Right middle cerebral artery aneurysm  Discharge Diagnoses: Cerebral artery aneurysm post treatment  Active Problems: Headaches; DM  Discharged Condition: stable; improved  Hospital Course: Pt with headaches x months.  Cerebral arteriogram was performed 03/12/12 and revealed Right Middle Cerebral Artery Aneurysm.  Options were discussed with pt as for possible treatment and he was then scheduled for aneurysm embolization.  R MCA aneurysm coiling performed in IR with Dr Corliss Skains 04/08/12. Procedure was without complication. Overnight stay in Neuro ICU was uneventful. Slept well; eating well; no N/V; passing gas; UOP good- yellow. Neuro intact. I have seen and examined pt; Dr Corliss Skains has also seen and examined pt. Plan for dc today. Continue home meds; dc Plavix  Consults: None  Significant Diagnostic Studies: Cerebral arteriogram  Treatments: R MCA aneurysm coiling  Discharge Exam: Blood pressure 134/93, pulse 72, temperature 98.1 F (36.7 C), temperature source Oral, resp. rate 14, height 5\' 11"  (1.803 m), weight 198 lb 10.2 oz (90.1 kg), SpO2 96.00%.  PE: Heart: RRR Lungs: CTA Abd: soft; +BS A/O Appropriate Smile =; face symmetrical FROM Rt groin NT; no bleeding; no hematoma Rt foot: 2+ pulses  Results for orders placed during the hospital encounter of 04/08/12  MRSA PCR SCREENING      Result Value Range   MRSA by PCR NEGATIVE  NEGATIVE  GLUCOSE, CAPILLARY      Result Value Range   Glucose-Capillary 195 (*) 70 - 99 mg/dL  HEPATIC FUNCTION PANEL      Result Value Range   Total Protein 7.0  6.0 - 8.3 g/dL   Albumin 3.9  3.5 - 5.2 g/dL   AST 26  0 - 37 U/L   ALT 42  0 - 53 U/L   Alkaline Phosphatase 97  39 - 117 U/L   Total Bilirubin 0.6  0.3 - 1.2 mg/dL   Bilirubin, Direct 0.1  0.0  - 0.3 mg/dL   Indirect Bilirubin 0.5  0.3 - 0.9 mg/dL  APTT      Result Value Range   aPTT 28  24 - 37 seconds  PLATELET INHIBITION P2Y12      Result Value Range   Platelet Function  P2Y12 136 (*) 194 - 418 PRU  PROTIME-INR      Result Value Range   Prothrombin Time 13.3  11.6 - 15.2 seconds   INR 1.02  0.00 - 1.49  GLUCOSE, CAPILLARY      Result Value Range   Glucose-Capillary 197 (*) 70 - 99 mg/dL  HEPARIN LEVEL (UNFRACTIONATED)      Result Value Range   Heparin Unfractionated 0.14 (*) 0.30 - 0.70 IU/mL  GLUCOSE, CAPILLARY      Result Value Range   Glucose-Capillary 132 (*) 70 - 99 mg/dL  GLUCOSE, CAPILLARY      Result Value Range   Glucose-Capillary 146 (*) 70 - 99 mg/dL  BASIC METABOLIC PANEL      Result Value Range   Sodium 139  135 - 145 mEq/L   Potassium 3.8  3.5 - 5.1 mEq/L   Chloride 109  96 - 112 mEq/L   CO2 22  19 - 32 mEq/L   Glucose, Bld 122 (*) 70 - 99 mg/dL   BUN 14  6 - 23 mg/dL   Creatinine, Ser 7.82  0.50 - 1.35 mg/dL   Calcium 7.8 (*) 8.4 - 10.5  mg/dL   GFR calc non Af Amer 70 (*) >90 mL/min   GFR calc Af Amer 81 (*) >90 mL/min  CBC WITH DIFFERENTIAL      Result Value Range   WBC 4.2  4.0 - 10.5 K/uL   RBC 4.34  4.22 - 5.81 MIL/uL   Hemoglobin 13.7  13.0 - 17.0 g/dL   HCT 16.1 (*) 09.6 - 04.5 %   MCV 88.2  78.0 - 100.0 fL   MCH 31.6  26.0 - 34.0 pg   MCHC 35.8  30.0 - 36.0 g/dL   RDW 40.9  81.1 - 91.4 %   Platelets 124 (*) 150 - 400 K/uL   Neutrophils Relative 54  43 - 77 %   Neutro Abs 2.3  1.7 - 7.7 K/uL   Lymphocytes Relative 33  12 - 46 %   Lymphs Abs 1.4  0.7 - 4.0 K/uL   Monocytes Relative 8  3 - 12 %   Monocytes Absolute 0.3  0.1 - 1.0 K/uL   Eosinophils Relative 4  0 - 5 %   Eosinophils Absolute 0.2  0.0 - 0.7 K/uL   Basophils Relative 1  0 - 1 %   Basophils Absolute 0.0  0.0 - 0.1 K/uL  HEPARIN LEVEL (UNFRACTIONATED)      Result Value Range   Heparin Unfractionated 0.15 (*) 0.30 - 0.70 IU/mL  GLUCOSE, CAPILLARY      Result Value  Range   Glucose-Capillary 90  70 - 99 mg/dL   Comment 1 Notify RN     Comment 2 Documented in Chart    GLUCOSE, CAPILLARY      Result Value Range   Glucose-Capillary 133 (*) 70 - 99 mg/dL   Comment 1 Notify RN     Comment 2 Documented in Chart    GLUCOSE, CAPILLARY      Result Value Range   Glucose-Capillary 109 (*) 70 - 99 mg/dL    Disposition: Pt with worsening headaches Cerebral arteriogram performed and revealed R MCA aneurysm 03/12/12 Coiling performed in IR with Dr Corliss Skains 04/08/12 Pt has done well. He has been seen and examined by Dr Corliss Skains Plan for dc now Cont all home meds: dc Plavix Return in 2 weeks for f/u ; we will call pt with time and date Pt has good understanding of dc instructions  Discharge Orders   Future Orders Complete By Expires     Call MD for:  difficulty breathing, headache or visual disturbances  As directed     Call MD for:  persistant dizziness or light-headedness  As directed     Call MD for:  persistant nausea and vomiting  As directed     Call MD for:  redness, tenderness, or signs of infection (pain, swelling, redness, odor or green/yellow discharge around incision site)  As directed     Call MD for:  severe uncontrolled pain  As directed     Call MD for:  temperature >100.4  As directed     Diet - low sodium heart healthy  As directed     Discharge instructions  As directed     Comments:      Follow up with Dr Corliss Skains in 2 weeks; we will call you with time and date; call [phone] if problems or questions    Discharge wound care:  As directed     Comments:      May shower today with dressing in place at rt groin; remove dressing after shower  and replace with band aid daily x 7 days    Driving Restrictions  As directed     Comments:      No driving x 10 days    Increase activity slowly  As directed     Scheduling Instructions:      Out of work x 2 weeks; follow up with Dr Corliss Skains at that time    Lifting restrictions  As directed      Comments:      No lifting over 10 lbs x 10 days        Medication List    TAKE these medications       EQL OMEGA 3 FISH OIL 1400 MG Caps  Take 1 capsule by mouth daily.     glimepiride 4 MG tablet  Commonly known as:  AMARYL  Take 4 mg by mouth 2 (two) times daily with a meal.     HYDROcodone-ibuprofen 7.5-200 MG per tablet  Commonly known as:  VICOPROFEN  Take 1 tablet by mouth every 6 (six) hours as needed. For pain     lisinopril 10 MG tablet  Commonly known as:  PRINIVIL,ZESTRIL  Take 10 mg by mouth daily.     minocycline 100 MG capsule  Commonly known as:  MINOCIN,DYNACIN  Take 100 mg by mouth daily as needed. For rash     nortriptyline 25 MG capsule  Commonly known as:  PAMELOR  Take 25 mg by mouth at bedtime.     omeprazole 40 MG capsule  Commonly known as:  PRILOSEC  Take 40 mg by mouth daily as needed. Acid reflux         Signed: Marsh Heckler A 04/09/2012, 8:51 AM

## 2012-04-10 ENCOUNTER — Encounter (HOSPITAL_COMMUNITY): Payer: Self-pay | Admitting: Interventional Radiology

## 2012-04-22 ENCOUNTER — Ambulatory Visit (HOSPITAL_COMMUNITY): Admit: 2012-04-22 | Payer: 59

## 2012-04-26 ENCOUNTER — Telehealth (HOSPITAL_COMMUNITY): Payer: Self-pay | Admitting: Interventional Radiology

## 2012-05-01 ENCOUNTER — Ambulatory Visit (HOSPITAL_COMMUNITY): Admission: RE | Admit: 2012-05-01 | Payer: 59 | Source: Ambulatory Visit

## 2012-05-08 ENCOUNTER — Ambulatory Visit (HOSPITAL_COMMUNITY)
Admission: RE | Admit: 2012-05-08 | Discharge: 2012-05-08 | Disposition: A | Discharge status: Home / Self Care | Payer: 59 | Source: Ambulatory Visit | Attending: Interventional Radiology | Admitting: Interventional Radiology

## 2012-05-27 ENCOUNTER — Other Ambulatory Visit (HOSPITAL_COMMUNITY): Payer: Self-pay | Admitting: Interventional Radiology

## 2012-05-27 DIAGNOSIS — I729 Aneurysm of unspecified site: Secondary | ICD-10-CM

## 2012-06-27 ENCOUNTER — Other Ambulatory Visit: Payer: Self-pay | Admitting: Radiology

## 2012-07-02 ENCOUNTER — Encounter (HOSPITAL_COMMUNITY): Payer: Self-pay | Admitting: Pharmacy Technician

## 2012-07-05 ENCOUNTER — Other Ambulatory Visit (HOSPITAL_COMMUNITY): Payer: Self-pay | Admitting: Interventional Radiology

## 2012-07-05 ENCOUNTER — Encounter (HOSPITAL_COMMUNITY): Payer: Self-pay

## 2012-07-05 ENCOUNTER — Ambulatory Visit (HOSPITAL_COMMUNITY)
Admission: RE | Admit: 2012-07-05 | Discharge: 2012-07-05 | Disposition: A | Discharge status: Home / Self Care | Payer: 59 | Source: Ambulatory Visit | Attending: Interventional Radiology | Admitting: Interventional Radiology

## 2012-07-05 DIAGNOSIS — Z79899 Other long term (current) drug therapy: Secondary | ICD-10-CM | POA: Insufficient documentation

## 2012-07-05 DIAGNOSIS — I729 Aneurysm of unspecified site: Secondary | ICD-10-CM

## 2012-07-05 DIAGNOSIS — R3915 Urgency of urination: Secondary | ICD-10-CM | POA: Insufficient documentation

## 2012-07-05 DIAGNOSIS — I10 Essential (primary) hypertension: Secondary | ICD-10-CM | POA: Insufficient documentation

## 2012-07-05 DIAGNOSIS — R51 Headache: Secondary | ICD-10-CM | POA: Insufficient documentation

## 2012-07-05 DIAGNOSIS — J40 Bronchitis, not specified as acute or chronic: Secondary | ICD-10-CM | POA: Insufficient documentation

## 2012-07-05 DIAGNOSIS — K219 Gastro-esophageal reflux disease without esophagitis: Secondary | ICD-10-CM | POA: Insufficient documentation

## 2012-07-05 DIAGNOSIS — Z87891 Personal history of nicotine dependence: Secondary | ICD-10-CM | POA: Insufficient documentation

## 2012-07-05 DIAGNOSIS — Z7982 Long term (current) use of aspirin: Secondary | ICD-10-CM | POA: Insufficient documentation

## 2012-07-05 DIAGNOSIS — I671 Cerebral aneurysm, nonruptured: Secondary | ICD-10-CM | POA: Insufficient documentation

## 2012-07-05 LAB — CBC WITH DIFFERENTIAL/PLATELET
Basophils Relative: 1 % (ref 0–1)
Eosinophils Absolute: 0.4 10*3/uL (ref 0.0–0.7)
Eosinophils Relative: 6 % — ABNORMAL HIGH (ref 0–5)
MCH: 31.1 pg (ref 26.0–34.0)
MCHC: 36.4 g/dL — ABNORMAL HIGH (ref 30.0–36.0)
Neutrophils Relative %: 62 % (ref 43–77)
Platelets: 152 10*3/uL (ref 150–400)

## 2012-07-05 LAB — BASIC METABOLIC PANEL
BUN: 24 mg/dL — ABNORMAL HIGH (ref 6–23)
Calcium: 9 mg/dL (ref 8.4–10.5)
Creatinine, Ser: 1.25 mg/dL (ref 0.50–1.35)
GFR calc non Af Amer: 63 mL/min — ABNORMAL LOW (ref >90)
Glucose, Bld: 126 mg/dL — ABNORMAL HIGH (ref 70–99)

## 2012-07-05 LAB — PROTIME-INR
INR: 1.03 (ref 0.00–1.49)
Prothrombin Time: 13.4 seconds (ref 11.6–15.2)

## 2012-07-05 MED ORDER — MIDAZOLAM HCL 2 MG/2ML IJ SOLN
INTRAMUSCULAR | Status: AC | PRN
  Administered 2012-07-05: 1 mg via INTRAVENOUS

## 2012-07-05 MED ORDER — SODIUM CHLORIDE 0.9 % IV SOLN
Freq: Once | INTRAVENOUS | Status: AC
  Administered 2012-07-05: 07:00:00 via INTRAVENOUS

## 2012-07-05 MED ORDER — HEPARIN SODIUM (PORCINE) 1000 UNIT/ML IJ SOLN
INTRAMUSCULAR | Status: AC | PRN
  Administered 2012-07-05: 500 [IU] via INTRAVENOUS

## 2012-07-05 MED ORDER — FENTANYL CITRATE 0.05 MG/ML IJ SOLN
INTRAMUSCULAR | Status: AC
  Filled 2012-07-05: qty 4

## 2012-07-05 MED ORDER — IOHEXOL 300 MG/ML  SOLN
150.0000 mL | Freq: Once | INTRAMUSCULAR | Status: AC | PRN
  Administered 2012-07-05: 40 mL via INTRAVENOUS

## 2012-07-05 MED ORDER — FENTANYL CITRATE 0.05 MG/ML IJ SOLN
INTRAMUSCULAR | Status: AC | PRN
  Administered 2012-07-05: 25 ug via INTRAVENOUS

## 2012-07-05 MED ORDER — SODIUM CHLORIDE 0.9 % IV SOLN: INTRAVENOUS | Status: DC

## 2012-07-05 MED ORDER — MIDAZOLAM HCL 2 MG/2ML IJ SOLN
INTRAMUSCULAR | Status: AC
  Filled 2012-07-05: qty 4

## 2012-07-05 NOTE — H&P (Signed)
Tommy Rojas is an 57 y.o. male.   HPI: pt with hx of right sided headaches. Found to have Rt MCA aneurysm and underwent uncomplicated Rt MCA coil embo on 04/18/12. He is doing well. He does report some intermittent headaches on the right, particularly posteriorly. No N/V, dizziness, visual changes, slurred speech, or extremity weakness. He is scheduled today for follow up arteriogram. HPI: HTN; H/As; R MCA aneurysm; Pancreatitis; Hep C  Past Medical History  Diagnosis Date  . Hypertension   . Headache   . Bronchitis   . Shortness of breath     with exertion  . Pancreatitis   . Diverticulitis   . Urgency of urination   . Hepatitis     Hx of Hep C  . Rash     back of head uses Minocycline PRN  . GERD (gastroesophageal reflux disease)     Past Surgical History  Procedure Laterality Date  . Cholecystectomy    . Cervical fusion    . Wrist surgery      right  . Ankle surgery      left  . Rotator cuff repair      right  . Radiology with anesthesia N/A 04/08/2012    Procedure: RADIOLOGY WITH ANESTHESIA;  Surgeon: Oneal Grout, MD;  Location: [facility] OR;  Service: Radiology;  Laterality: N/A;    No family history on file. Social History:  reports that he quit smoking about 2 years ago. He has never used smokeless tobacco. He reports that he drinks about 3.6 ounces of alcohol per week. He reports that he does not use illicit drugs.  Allergies: No Known Allergies  Medications: amitriptyline (ELAVIL) 10 MG tablet (Taking) Sig - Route: Take 30 mg by mouth at bedtime. - Oral Class: Historical Med aspirin EC 325 MG tablet (Taking) Sig - Route: Take 325 mg by mouth daily. - Oral Class: Historical Med glimepiride (AMARYL) 4 MG tablet (Taking) Sig - Route: Take 4 mg by mouth 2 (two) times daily with a meal. - Oral Class: Historical Med Number of times this order has been changed since signing: 1 Order [address] HYDROcodone-ibuprofen (VICOPROFEN) 7.5-200 MG per tablet (Taking) Sig -  Route: Take 1 tablet by mouth every 6 (six) hours as needed for pain. - Oral Class: Historical Med lisinopril (PRINIVIL,ZESTRIL) 10 MG tablet (Taking) Sig - Route: Take 10 mg by mouth daily. - Oral Class: Historical Med minocycline (MINOCIN,DYNACIN) 100 MG capsule (Taking) Sig - Route: Take 100 mg by mouth daily. - Oral Class: Historical Med Number of times this order has been changed since signing: 2 Order [address] Omega-3 Fatty Acids (FISH OIL PO) (Taking) Sig - Route: Take 1 capsule by mouth daily. -   Results for orders placed during the hospital encounter of 07/05/12 (from the past 48 hour(s))  GLUCOSE, CAPILLARY     Status: Abnormal   Collection Time    07/05/12  7:15 AM      Result Value Range   Glucose-Capillary 119 (*) 70 - 99 mg/dL   No results found.  Review of Systems  Constitutional: Negative for fever and chills.  HENT: Negative for hearing loss.   Eyes: Negative for blurred vision.  Respiratory: Negative for cough and shortness of breath.   Gastrointestinal: Negative for nausea, vomiting and abdominal pain.  Neurological: Positive for headaches. Negative for dizziness and weakness.    Blood pressure 134/92, pulse 83, temperature 97.6 F (36.4 C), temperature source Oral, resp. rate 20, height 5\' 11"  (  1.803 m), weight 202 lb (91.627 kg), SpO2 99.00%. Physical Exam  Constitutional: He is oriented to person, place, and time. He appears well-developed and well-nourished.  Eyes: EOM are normal.  Neck: Normal range of motion.  Cardiovascular: Normal rate, regular rhythm and normal heart sounds.   No murmur heard. Respiratory: Effort normal and breath sounds normal. He has no wheezes.  GI: Soft. Bowel sounds are normal. There is no tenderness.  Musculoskeletal: Normal range of motion. He exhibits no edema and no tenderness.  Neurological: He is alert and oriented to person, place, and time. No cranial nerve deficit. Coordination normal.  Skin: Skin is warm and dry.   Psychiatric: He has a normal mood and affect. His behavior is normal. Judgment and thought content normal.     Assessment/Plan S/p rt MCA aneurysm coil embo 3 months ago For follow up cerebral arteriogram today Reviewed procedure, risks, complications, use of sedation. Labs all pending Consent signed and in chart   Brayton El PA-C 07/05/2012, 7:52 AM

## 2012-07-05 NOTE — ED Notes (Signed)
Patient denies pain and is resting comfortably.  

## 2012-07-05 NOTE — ED Notes (Signed)
Groin site level 0 

## 2012-07-05 NOTE — ED Notes (Signed)
Short stay called for ongoing recovery bed until time of discharge at 1230.  Spoke with Archie Patten, RN.  No available beds at this time, pt put on waiting list.

## 2012-07-05 NOTE — Procedures (Signed)
S/P 4 vessel cerebral arteriogram RT CFA approach . Findings . 1.Nearly completely obliterated RT MCA aneurysm.

## 2012-12-26 ENCOUNTER — Other Ambulatory Visit: Payer: Self-pay

## 2013-03-03 ENCOUNTER — Other Ambulatory Visit (HOSPITAL_COMMUNITY): Payer: Self-pay | Admitting: Interventional Radiology

## 2013-03-03 DIAGNOSIS — I729 Aneurysm of unspecified site: Secondary | ICD-10-CM

## 2013-04-03 ENCOUNTER — Other Ambulatory Visit: Payer: Self-pay | Admitting: Radiology

## 2013-04-07 ENCOUNTER — Encounter (HOSPITAL_COMMUNITY): Payer: Self-pay | Admitting: Pharmacy Technician

## 2013-04-11 ENCOUNTER — Other Ambulatory Visit (HOSPITAL_COMMUNITY): Payer: Self-pay | Admitting: Interventional Radiology

## 2013-04-11 ENCOUNTER — Encounter (HOSPITAL_COMMUNITY): Payer: Self-pay

## 2013-04-11 ENCOUNTER — Ambulatory Visit (HOSPITAL_COMMUNITY)
Admission: RE | Admit: 2013-04-11 | Discharge: 2013-04-11 | Disposition: A | Discharge status: Home / Self Care | Payer: 59 | Source: Ambulatory Visit | Attending: Interventional Radiology | Admitting: Interventional Radiology

## 2013-04-11 DIAGNOSIS — Z9889 Other specified postprocedural states: Secondary | ICD-10-CM | POA: Insufficient documentation

## 2013-04-11 DIAGNOSIS — K219 Gastro-esophageal reflux disease without esophagitis: Secondary | ICD-10-CM | POA: Insufficient documentation

## 2013-04-11 DIAGNOSIS — R21 Rash and other nonspecific skin eruption: Secondary | ICD-10-CM | POA: Insufficient documentation

## 2013-04-11 DIAGNOSIS — Z87891 Personal history of nicotine dependence: Secondary | ICD-10-CM | POA: Insufficient documentation

## 2013-04-11 DIAGNOSIS — Z8619 Personal history of other infectious and parasitic diseases: Secondary | ICD-10-CM | POA: Insufficient documentation

## 2013-04-11 DIAGNOSIS — I729 Aneurysm of unspecified site: Secondary | ICD-10-CM

## 2013-04-11 DIAGNOSIS — E119 Type 2 diabetes mellitus without complications: Secondary | ICD-10-CM | POA: Insufficient documentation

## 2013-04-11 DIAGNOSIS — Z7982 Long term (current) use of aspirin: Secondary | ICD-10-CM | POA: Insufficient documentation

## 2013-04-11 DIAGNOSIS — I10 Essential (primary) hypertension: Secondary | ICD-10-CM | POA: Insufficient documentation

## 2013-04-11 DIAGNOSIS — I671 Cerebral aneurysm, nonruptured: Secondary | ICD-10-CM | POA: Insufficient documentation

## 2013-04-11 LAB — BASIC METABOLIC PANEL
BUN: 24 mg/dL — AB (ref 6–23)
CALCIUM: 9.1 mg/dL (ref 8.4–10.5)
CHLORIDE: 105 meq/L (ref 96–112)
CO2: 23 meq/L (ref 19–32)
CREATININE: 1.13 mg/dL (ref 0.50–1.35)
GFR calc non Af Amer: 70 mL/min — ABNORMAL LOW (ref >90)
GFR, EST AFRICAN AMERICAN: 82 mL/min — AB (ref >90)
Glucose, Bld: 150 mg/dL — ABNORMAL HIGH (ref 70–99)
Potassium: 4.3 mEq/L (ref 3.7–5.3)
Sodium: 141 mEq/L (ref 137–147)

## 2013-04-11 LAB — CBC
HEMATOCRIT: 43.9 % (ref 39.0–52.0)
Hemoglobin: 15.3 g/dL (ref 13.0–17.0)
MCH: 31 pg (ref 26.0–34.0)
MCHC: 34.9 g/dL (ref 30.0–36.0)
MCV: 89 fL (ref 78.0–100.0)
PLATELETS: 148 10*3/uL — AB (ref 150–400)
RBC: 4.93 MIL/uL (ref 4.22–5.81)
RDW: 12.6 % (ref 11.5–15.5)
WBC: 5.1 10*3/uL (ref 4.0–10.5)

## 2013-04-11 LAB — APTT: APTT: 29 s (ref 24–37)

## 2013-04-11 LAB — PROTIME-INR
INR: 1.09 (ref 0.00–1.49)
Prothrombin Time: 13.9 seconds (ref 11.6–15.2)

## 2013-04-11 LAB — GLUCOSE, CAPILLARY
Glucose-Capillary: 156 mg/dL — ABNORMAL HIGH (ref 70–99)
Glucose-Capillary: 104 mg/dL — ABNORMAL HIGH (ref 70–99)

## 2013-04-11 MED ORDER — FENTANYL CITRATE 0.05 MG/ML IJ SOLN
INTRAMUSCULAR | Status: AC
  Filled 2013-04-11: qty 2

## 2013-04-11 MED ORDER — HEPARIN SOD (PORK) LOCK FLUSH 100 UNIT/ML IV SOLN
INTRAVENOUS | Status: AC | PRN
  Administered 2013-04-11: 1000 [IU] via INTRAVENOUS

## 2013-04-11 MED ORDER — MIDAZOLAM HCL 2 MG/2ML IJ SOLN
INTRAMUSCULAR | Status: AC | PRN
  Administered 2013-04-11: 1 mg via INTRAVENOUS

## 2013-04-11 MED ORDER — HYDRALAZINE HCL 20 MG/ML IJ SOLN
INTRAMUSCULAR | Status: AC | PRN
  Administered 2013-04-11: 5 mg via INTRAVENOUS

## 2013-04-11 MED ORDER — HYDRALAZINE HCL 20 MG/ML IJ SOLN
INTRAMUSCULAR | Status: AC
  Filled 2013-04-11: qty 1

## 2013-04-11 MED ORDER — IOHEXOL 300 MG/ML  SOLN
150.0000 mL | Freq: Once | INTRAMUSCULAR | Status: AC | PRN
  Administered 2013-04-11: 80 mL via INTRA_ARTERIAL

## 2013-04-11 MED ORDER — MIDAZOLAM HCL 2 MG/2ML IJ SOLN
INTRAMUSCULAR | Status: AC
  Filled 2013-04-11: qty 2

## 2013-04-11 MED ORDER — SODIUM CHLORIDE 0.9 % IV SOLN: INTRAVENOUS | Status: AC

## 2013-04-11 MED ORDER — SODIUM CHLORIDE 0.9 % IV SOLN
INTRAVENOUS | Status: DC
  Administered 2013-04-11: 1000 mL via INTRAVENOUS

## 2013-04-11 MED ORDER — FENTANYL CITRATE 0.05 MG/ML IJ SOLN
INTRAMUSCULAR | Status: AC | PRN
  Administered 2013-04-11 (×3): 25 ug via INTRAVENOUS

## 2013-04-11 NOTE — ED Notes (Signed)
R post tib +3

## 2013-04-11 NOTE — Procedures (Signed)
S/P bilateral common carotid ,Rt vert angiogram ,and selective RT external carotid. Arteriogram. Rt CFA approach. Findings. 1.Obliterated Rt MCA aneurysm No  Coil compaction. 2.Small RT sded AV fistula arising from the RT post auricular artery. At the level of C1

## 2013-04-11 NOTE — ED Notes (Addendum)
+  3 Post Tib pulse on right, DP absent per Tillman Sers, RT.

## 2013-04-11 NOTE — ED Notes (Signed)
O2 d/c'd.  Exoseal closure device by [facility], RT.

## 2013-04-11 NOTE — ED Notes (Signed)
O2 2l/[place] started 

## 2013-04-11 NOTE — ED Notes (Signed)
MD at bedside.  Explaining findings/procedure to pt and wife.

## 2013-04-11 NOTE — Discharge Instructions (Signed)
Angiography, Care After ° °Refer to this sheet in the next few weeks. These instructions provide you with information on caring for yourself after your procedure. Your health care provider may also give you more specific instructions. Your treatment has been planned according to current medical practices, but problems sometimes occur. Call your health care provider if you have any problems or questions after your procedure.  °WHAT TO EXPECT AFTER THE PROCEDURE °After your procedure, it is typical to have the following sensations: °· Minor discomfort or tenderness and a small bump at the catheter insertion site. The bump should usually decrease in size and tenderness within 1 to 2 weeks. °· Any bruising will usually fade within 2 to 4 weeks. °HOME CARE INSTRUCTIONS  °· You may need to keep taking blood thinners if they were prescribed for you. Only take over-the-counter or prescription medicines for pain, fever, or discomfort as directed by your health care provider. °· Do not apply powder or lotion to the site. °· Do not sit in a bathtub, swimming pool, or whirlpool for 5 to 7 days. °· You may shower 24 hours after the procedure. Remove the bandage (dressing) and gently wash the site with plain soap and water. Gently pat the site dry. °· Inspect the site at least twice daily. °· Limit your activity for the first 24 hours. Do not bend, squat, or lift anything over 10 lb (9 kg) or as directed by your health care provider. °· Do not drive home if you are discharged the day of the procedure. Have someone else drive you. Follow instructions about when you can drive or return to work. °SEEK MEDICAL CARE IF: °· You get lightheaded when standing up. °· You have drainage (other than a small amount of blood on the dressing). °· You have chills. °· You have a fever. °· You have redness, warmth, swelling, or pain at the insertion site. °SEEK IMMEDIATE MEDICAL CARE IF:  °· You develop chest pain or shortness of breath, feel  faint, or pass out. °· You have bleeding, swelling larger than a walnut, or drainage from the catheter insertion site. °· You develop pain, discoloration, coldness, or severe bruising in the leg or arm that held the catheter. °· You have heavy bleeding from the site. If this happens, hold pressure on the site. °MAKE SURE YOU: °· Understand these instructions. °· Will watch your condition. °· Will get help right away if you are not doing well or get worse. °Document Released: 08/25/2004 Document Revised: 10/09/2012 Document Reviewed: 07/01/2012 °[employer]® Patient Information ©2014 [employer]. ° °

## 2013-04-11 NOTE — H&P (Signed)
Chief Complaint: "I am here for follow-up procedure for my aneurysm."  HPI: Tommy Rojas is an 58 y.o. male with PMHx of right middle cerebral artery aneurysm s/p coiling 03/2012, he is here today for a follow-up arteriogram. The patient original complaints consisted of right temporal region headaches and blurred vision. He states he has not experienced headaches recently, but does have "sharp" temporal region 1 second long pain when turning his head to the right. He is also complaining of worsening blurry vision, he is unsure if one eye is worse than the other. He also admits to intermittent lightheadedness and dizziness after standing for a period of time. He denies any slurred speech or extremity weakness. He states his BP has been well controlled on his lisinopril and his diabetes is also well controlled on his glimeperide. He denies any tobacco use. He did start an exercise program after last catheter angiogram for a couple weeks, but then stopped because he "didn't need it anymore." He does have a trip to [place] coming up in October which will require lots of walking and his wife is concerned about his exercise tolerance. He denies any chest pain or shortness of breath. He denies any active bleeding or bruising. He denies any difficulty with previous sedation or iodinated contrast. He denies any history of sleep apnea.   Past Medical History:  Past Medical History  Diagnosis Date  . Hypertension   . Headache(784.0)   . Bronchitis   . Shortness of breath     with exertion  . Pancreatitis   . Diverticulitis   . Urgency of urination   . Hepatitis     Hx of Hep C  . Rash     back of head uses Minocycline PRN  . GERD (gastroesophageal reflux disease)     Past Surgical History:  Past Surgical History  Procedure Laterality Date  . Cholecystectomy    . Cervical fusion    . Wrist surgery      right  . Ankle surgery      left  . Rotator cuff repair      right  . Radiology with anesthesia  N/A 04/08/2012    Procedure: RADIOLOGY WITH ANESTHESIA;  Surgeon: Rob Hickman, MD;  Location: [facility];  Service: Radiology;  Laterality: N/A;    Family History: No family history on file.  Social History:  reports that he quit smoking about 3 years ago. He has never used smokeless tobacco. He reports that he drinks about 3.6 ounces of alcohol per week. He reports that he does not use illicit drugs.  Allergies: No Known Allergies  Medications:   Medication List    ASK your doctor about these medications       aspirin EC 81 MG tablet  Take 81 mg by mouth daily.     cyclobenzaprine 10 MG tablet  Commonly known as:  FLEXERIL  Take 10 mg by mouth 3 (three) times daily as needed for muscle spasms.     DULoxetine 60 MG capsule  Commonly known as:  CYMBALTA  Take 60 mg by mouth daily.     FISH OIL PO  Take 1 capsule by mouth daily.     glimepiride 4 MG tablet  Commonly known as:  AMARYL  Take 4 mg by mouth daily with breakfast.     HYDROcodone-ibuprofen 7.5-200 MG per tablet  Commonly known as:  VICOPROFEN  Take 1 tablet by mouth every 6 (six) hours as needed for pain.  lisinopril 10 MG tablet  Commonly known as:  PRINIVIL,ZESTRIL  Take 10 mg by mouth daily.     minocycline 100 MG capsule  Commonly known as:  MINOCIN,DYNACIN  Take 100 mg by mouth daily as needed. For acne breakouts.       Please HPI for pertinent positives, otherwise complete 10 system ROS negative.  Physical Exam: BP 133/87  Pulse 77  Temp(Src) 97.9 F (36.6 C) (Oral)  Resp 18  Ht _0  (1.803 m)  Wt 203 lb (92.08 kg)  BMI 28.33 kg/m2  SpO2 98% Body mass index is 28.33 kg/(m^2).  General Appearance:  Alert, cooperative, no distress  Head:  Normocephalic, without obvious abnormality, atraumatic  Neck: Supple, symmetrical, trachea midline  Lungs:   Clear to auscultation bilaterally, no w/r/r, respirations unlabored without use of accessory muscles.  Chest Wall:  No tenderness or  deformity  Heart:  Regular rate and rhythm, S1, S2 normal, no murmur, rub or gallop.  Abdomen:   Soft, non-tender, non distended, (+) BS  Extremities: Extremities normal, atraumatic, no cyanosis or edema  Pulses: 1+ and symmetric  Neurologic: Normal affect, no gross deficits.   Results for orders placed during the hospital encounter of 04/11/13 (from the past 48 hour(s))  GLUCOSE, CAPILLARY     Status: Abnormal   Collection Time    04/11/13  7:05 AM      Result Value Ref Range   Glucose-Capillary 156 (*) 70 - 99 mg/dL   Comment 1 Notify RN     Comment 2 Documented in Chart    APTT     Status: None   Collection Time    04/11/13  7:13 AM      Result Value Ref Range   aPTT 29  24 - 37 seconds  BASIC METABOLIC PANEL     Status: Abnormal   Collection Time    04/11/13  7:13 AM      Result Value Ref Range   Sodium 141  137 - 147 mEq/L   Potassium 4.3  3.7 - 5.3 mEq/L   Chloride 105  96 - 112 mEq/L   CO2 23  19 - 32 mEq/L   Glucose, Bld 150 (*) 70 - 99 mg/dL   BUN 24 (*) 6 - 23 mg/dL   Creatinine, Ser 1.13  0.50 - 1.35 mg/dL   Calcium 9.1  8.4 - 10.5 mg/dL   GFR calc non Af Amer 70 (*) >90 mL/min   GFR calc Af Amer 82 (*) >90 mL/min   Comment: (NOTE)     The eGFR has been calculated using the CKD EPI equation.     This calculation has not been validated in all clinical situations.     eGFR's persistently <90 mL/min signify possible Chronic Kidney     Disease.  CBC     Status: Abnormal   Collection Time    04/11/13  7:13 AM      Result Value Ref Range   WBC 5.1  4.0 - 10.5 K/uL   RBC 4.93  4.22 - 5.81 MIL/uL   Hemoglobin 15.3  13.0 - 17.0 g/dL   HCT 43.9  39.0 - 52.0 %   MCV 89.0  78.0 - 100.0 fL   MCH 31.0  26.0 - 34.0 pg   MCHC 34.9  30.0 - 36.0 g/dL   RDW 12.6  11.5 - 15.5 %   Platelets 148 (*) 150 - 400 K/uL  PROTIME-INR     Status: None   Collection  Time    04/11/13  7:13 AM      Result Value Ref Range   Prothrombin Time 13.9  11.6 - 15.2 seconds   INR 1.09  0.00  - 1.49   No results found.  Assessment/Plan History of right middle cerebral artery aneurysm s/p coiling 03/2012 Last catheter cerebral angiogram 06/2012 Patient is scheduled today for follow-up cerebral arteriogram, he is currently on anti-hypertensive medication, diabetes medication and aspirin 27m daily. Patient has been NPO, labs and images reviewed. Risks and Benefits discussed with the patient. All of the patient's questions were answered, patient is agreeable to proceed. Consent signed and in chart.   MTsosie BillingD PA-C 04/11/2013, 8:16 AM

## 2013-04-11 NOTE — ED Notes (Signed)
+  3 post tib on right, unable to palpate DP

## 2013-12-01 IMAGING — XA IR ANGIO INTRA EXTRACRAN SEL COM CAROTID INNOMINATE BILAT MOD SE
1 series · 13 of 24 positions shown · IV contrast (IODINE)
Comparison: MRI and MRA brain of 02/29/2012.

CLINICAL DATA: Severe right-sided headaches associated with visual
blurring.  Abnormal MRI of the brain and MRA of the brain
suggestive of intracranial aneurysm.

BILATERAL COMMON CAROTID ARTERY AND BILATERAL VERTEBRAL ARTERY
ANGIOGRAMS

[Series 300: neuro · 13 of 172 slices shown]
[im 1/172]
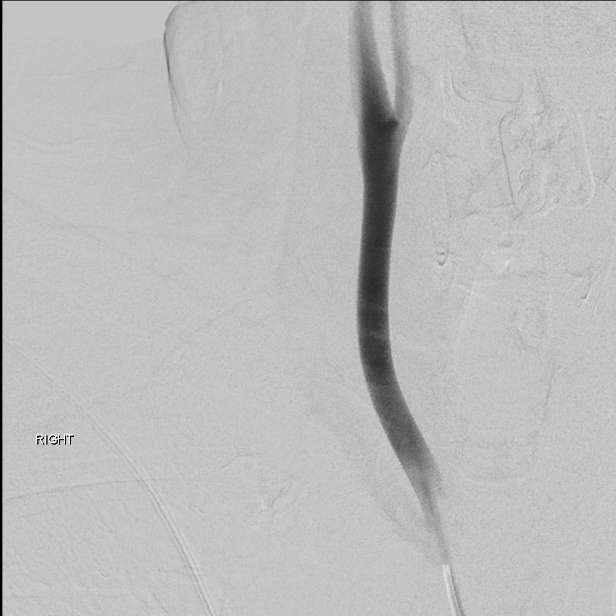
[im 15/172]
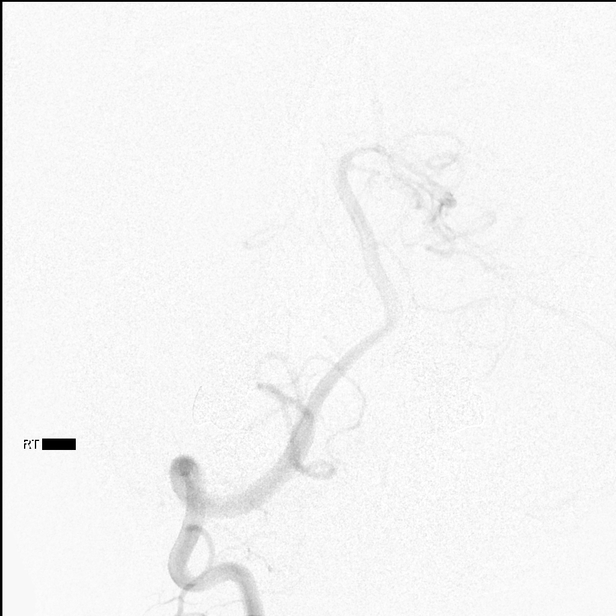
[im 30/172]
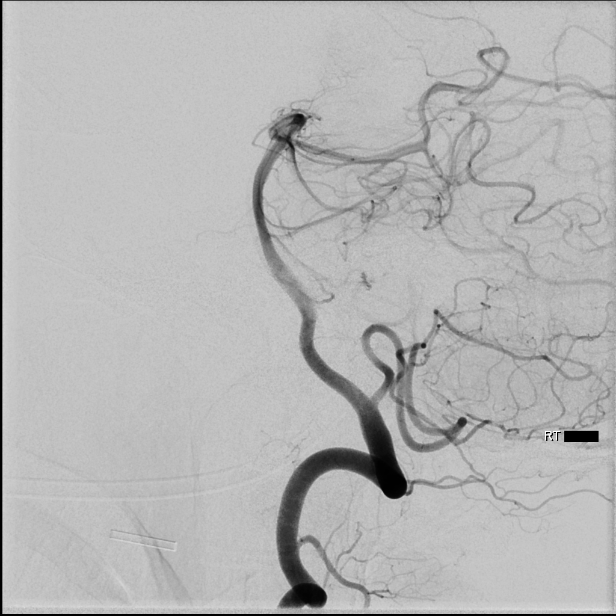
[im 45/172]
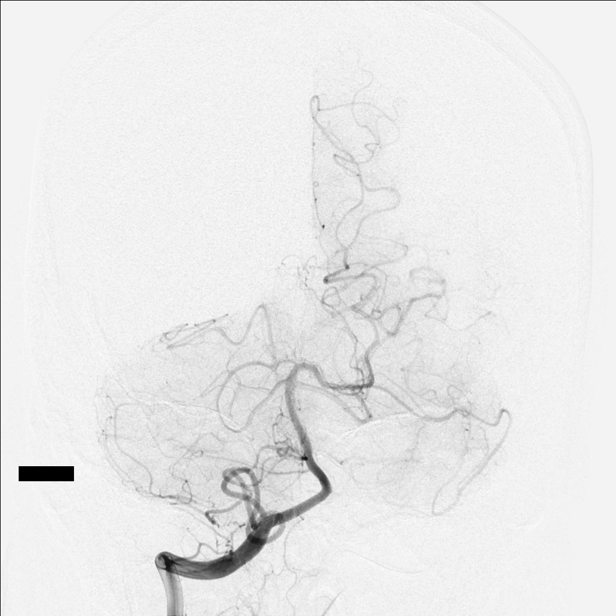
[im 60/172]
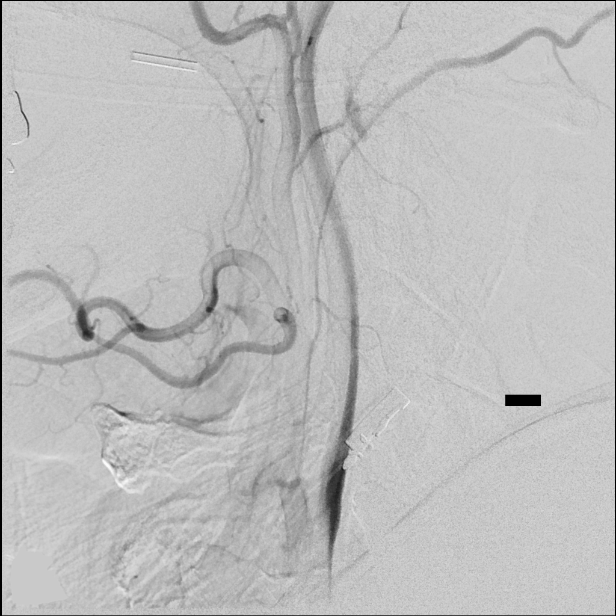
[im 75/172]
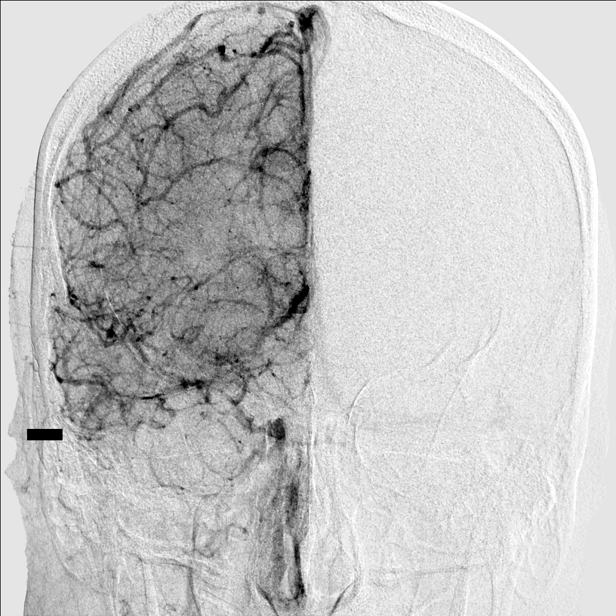
[im 90/172]
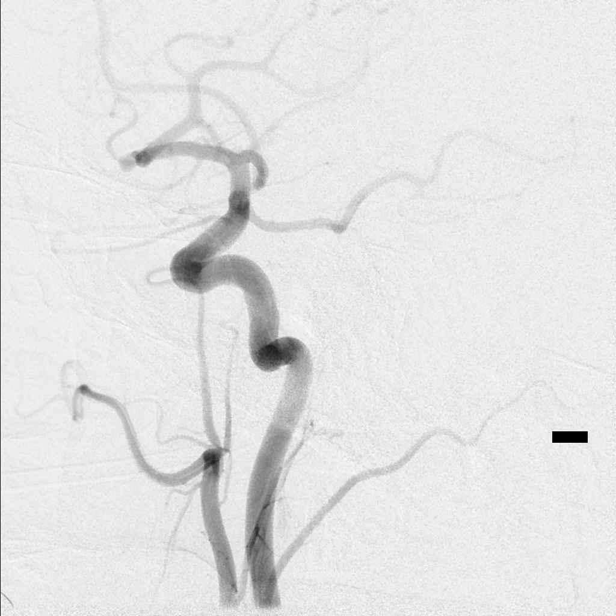
[im 97/172]
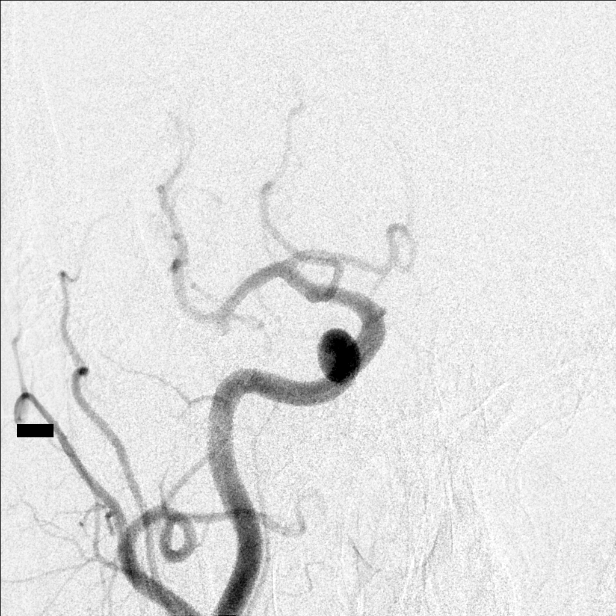
[im 112/172]
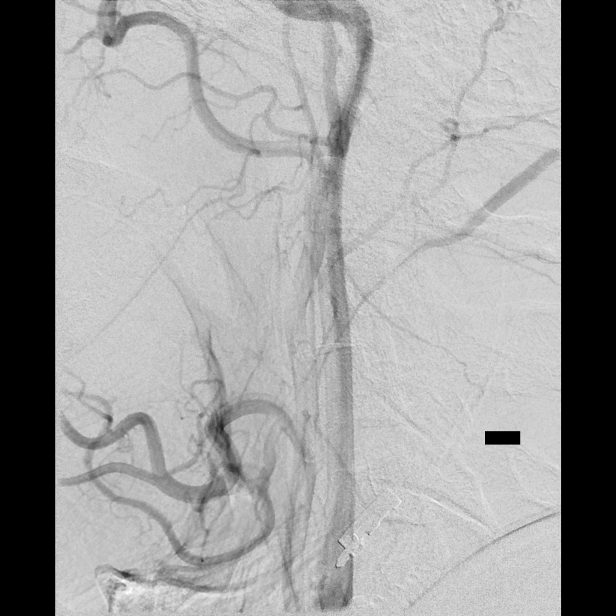
[im 127/172]
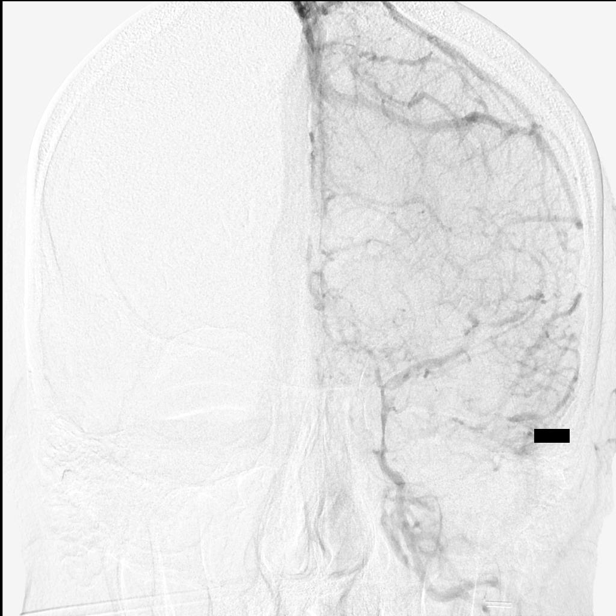
[im 142/172]
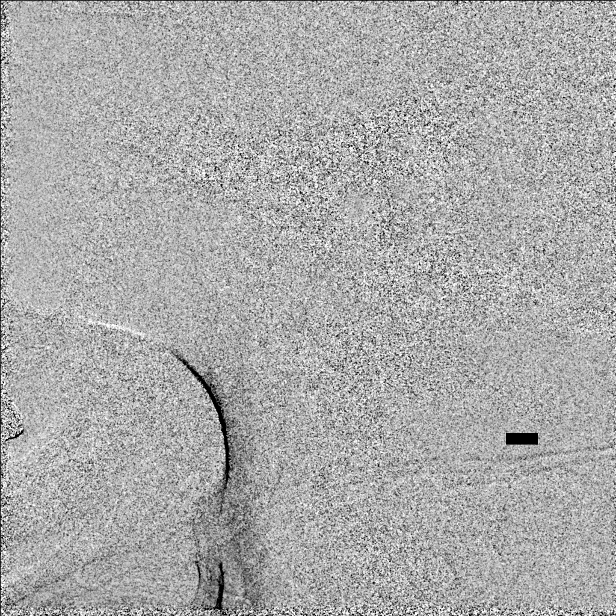
[im 157/172]
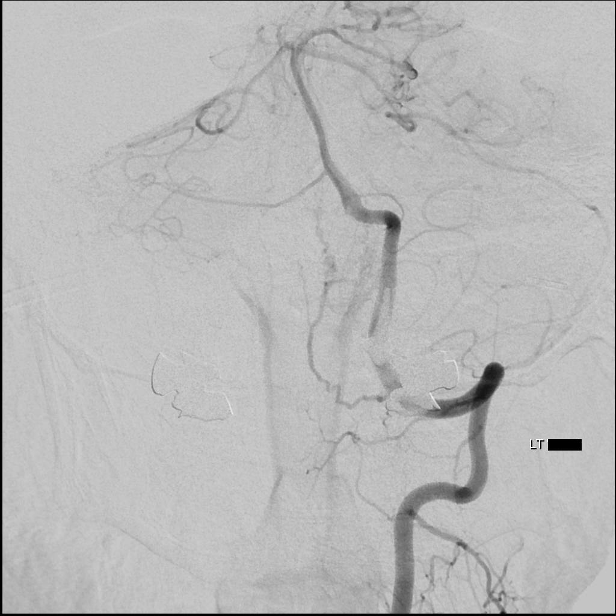
[im 172/172]
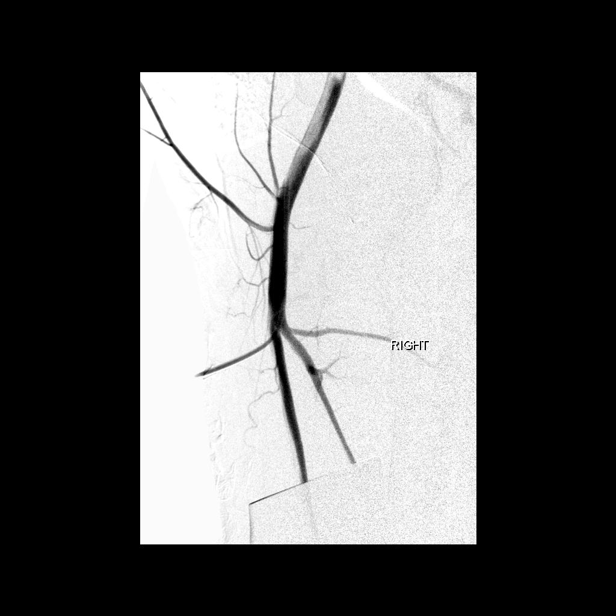

[13 of 24 positions shown; findings below may reference images not displayed]

FINDINGS: Following a full explanation of the procedure along with
the potential associated complications, an informed witnessed
consent was obtained.

The right groin was prepped and draped in the usual sterile
fashion.  Thereafter using modified Seldinger technique,
transfemoral access into the right common femoral artery was
obtained without difficulty.  Over a 0.035-inch guidewire, a 5-
French Pinnacle sheath was inserted.  Through this and also over a
0.035-inch guidewire, a 5-French JB1 catheter was advanced to the
aortic arch region and selectively positioned in the right common
carotid artery, the right vertebral artery, the left common carotid
artery and left vertebral artery.

There were no acute complications.  The patient tolerated the
procedure well.

Medications utilized: Versed 2 mg IV.  Fentanyl 50 mcg IV.

Contrast: Pmnipaque-L99 approximately 55 ml.
FINDINGS: The right vertebral artery origin is normal.  The vessel
opacifies normally to the cranial skull base.

There is normal opacification of the right posterior inferior
cerebellar artery and the right vertebrobasilar junction.

The basilar artery, the left posterior cerebral artery, the
superior cerebellar arteries and the anterior-inferior cerebellar
arteries opacify normally into the capillary and venous phases.
Unopacified blood is seen in the basilar artery from the
contralateral vertebral artery.

Also seen is the transient opacification of the right posterior
cerebral artery proximally probably related to inflow from the
anterior circulation to be described below.

The right common carotid arteriogram demonstrates the right
external carotid artery and its major branches to be normal.

The right internal carotid artery at the bulb to the cranial skull
base opacifies normally.  The petrous, cavernous and the
supraclinoid segments are normal.

A prominent right posterior communicating artery is seen to opacify
the right posterior cerebral artery distribution.

The right middle and the right anterior cerebral arteries opacify
normally into the capillary and venous phases.

Arising from the inferior aspect of the right M1 segment is a tear-
drop aneurysm projecting inferiorly.  This measures approximately
3.4 mm x 2.4 mm.

The left common carotid arteriogram demonstrates the left external
carotid artery and its major branches to be normal.

The left internal carotid artery at the bulb to the cranial skull
base opacifies normally.

The petrous, cavernous and the supraclinoid segments are normal.
Transient opacification of the left posterior communicating artery
is seen.

The left middle and the left anterior cerebral arteries opacify
normally into the capillary and venous phases.

The left vertebral artery origin is normal.  The vessel opacifies
normally to the cranial skull base.

There is normal opacification of the left posterior inferior
cerebellar artery and the left vertebrobasilar junction.

The basilar artery, the posterior cerebral arteries, superior
cerebellar arteries and the anterior inferior cerebellar arteries
opacify normally into the capillary and venous phases.

Unopacified blood is seen the basilar artery from the contralateral
vertebral artery.

IMPRESSION
1.  Approximately 3.4 mm x 2.4 mm right middle cerebral artery
distal trunk aneurysm.

The findings were discussed with the patient and the patient's
spouse.

## 2013-12-28 IMAGING — XA IR TRANSCATH EMBOLIZATION
1 series · 11 of 24 positions shown · IV contrast (IODINE)
Comparison: Catheter angiogram of 03/12/2012.

CLINICAL DATA: Intractable right side headaches.  History of right-
sided intracranial aneurysm.

RIGHT COMMON CAROTID ARTERIOGRAM FOLLOWED BY ENDOVASCULAR COILING
OF RIGHT MIDDLE CEREBRAL ARTERY ANEURYSM

[Series 300: ir transcath/emboliz · 11 of 144 slices shown]
[im 7/144]
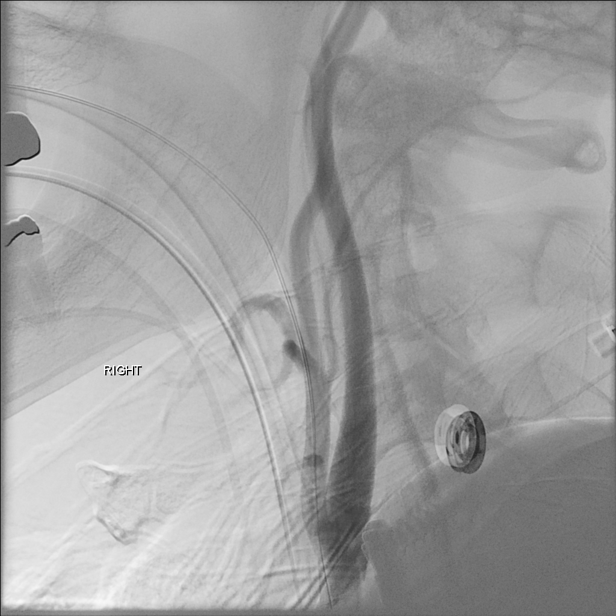
[im 19/144]
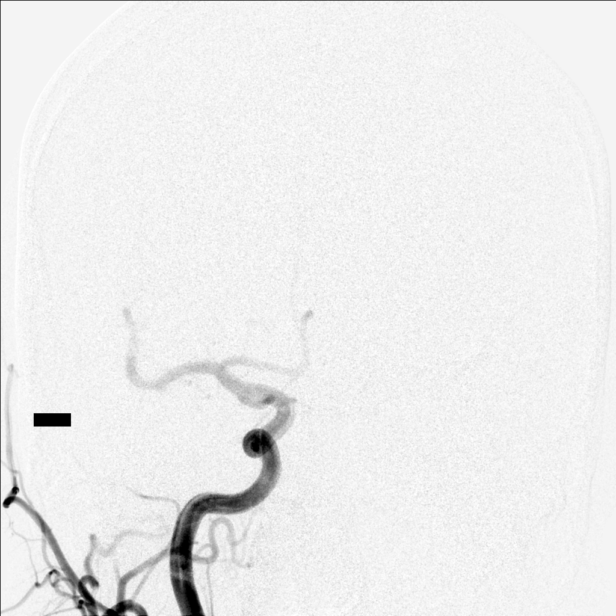
[im 32/144]
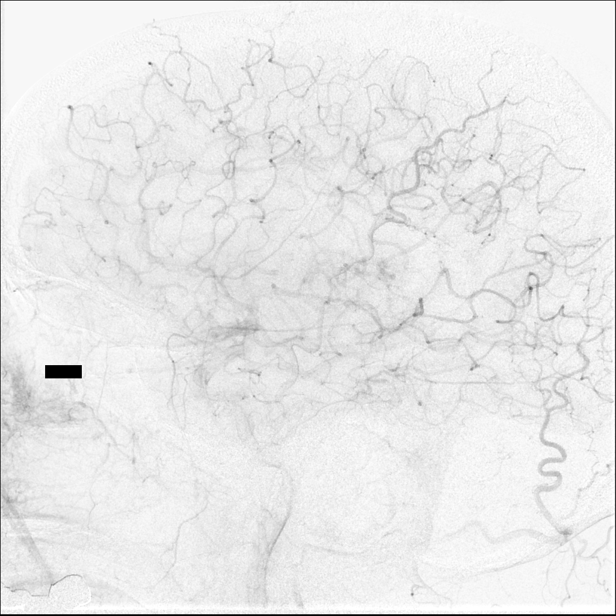
[im 44/144]
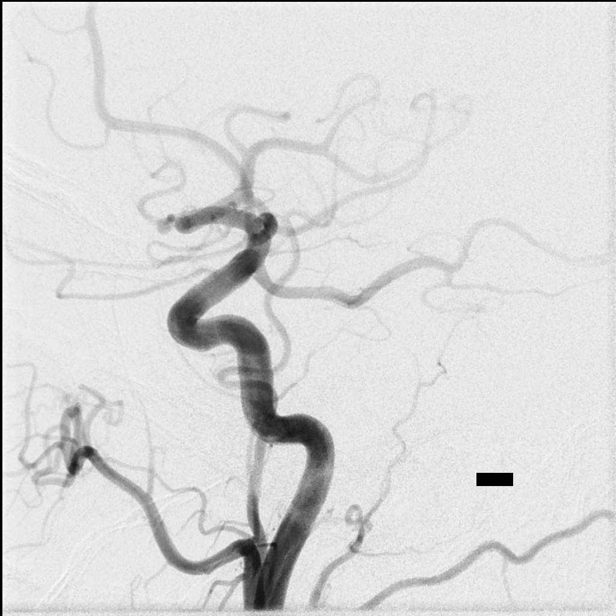
[im 56/144]
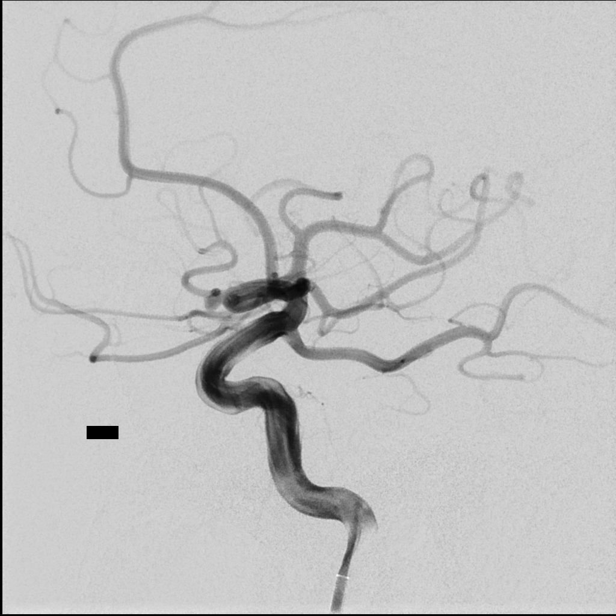
[im 75/144]
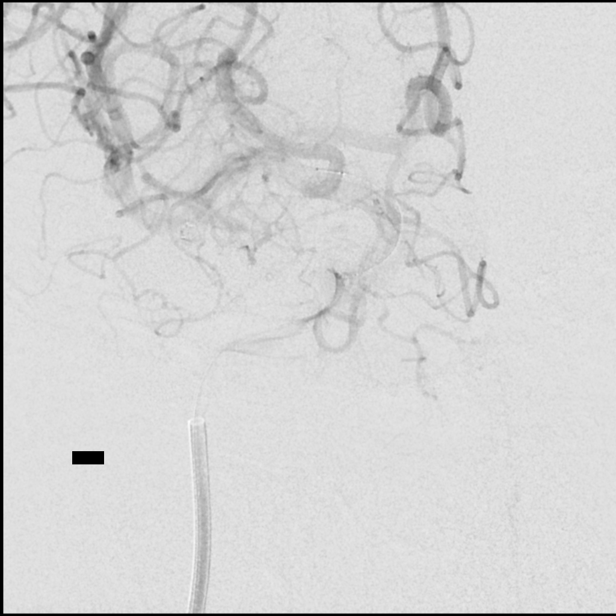
[im 88/144]
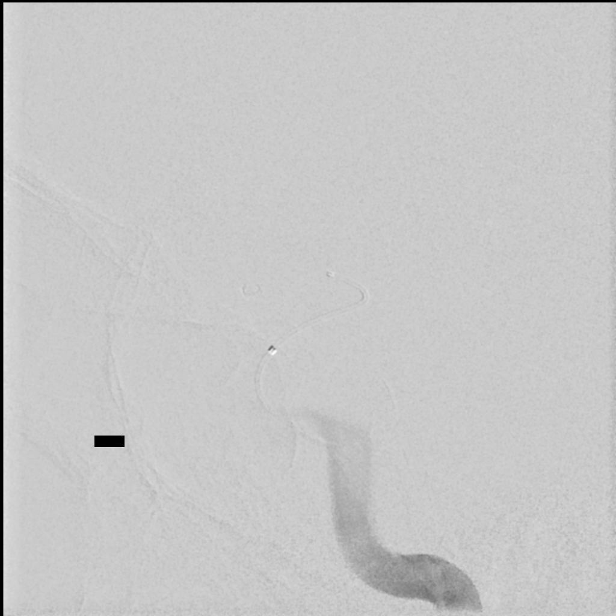
[im 100/144]
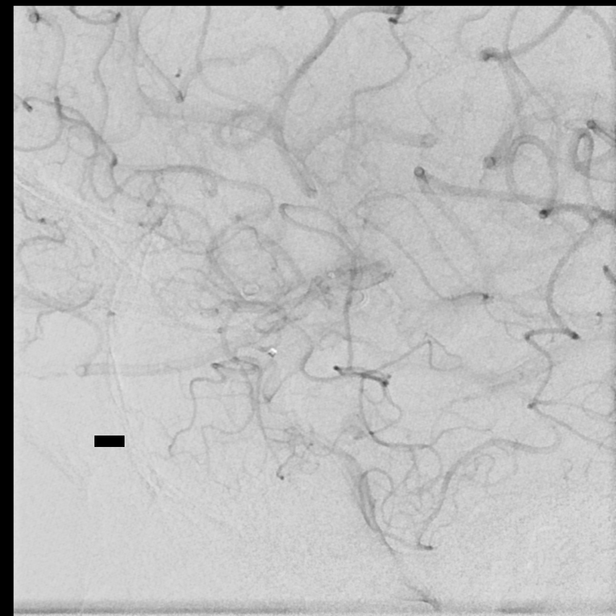
[im 112/144]
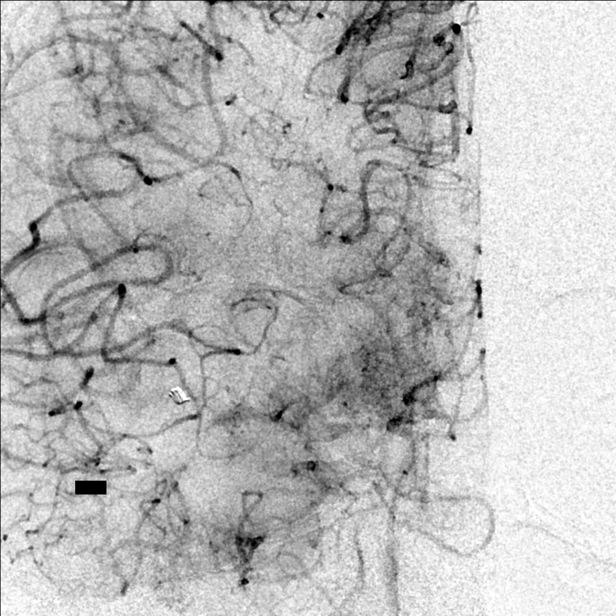
[im 125/144]
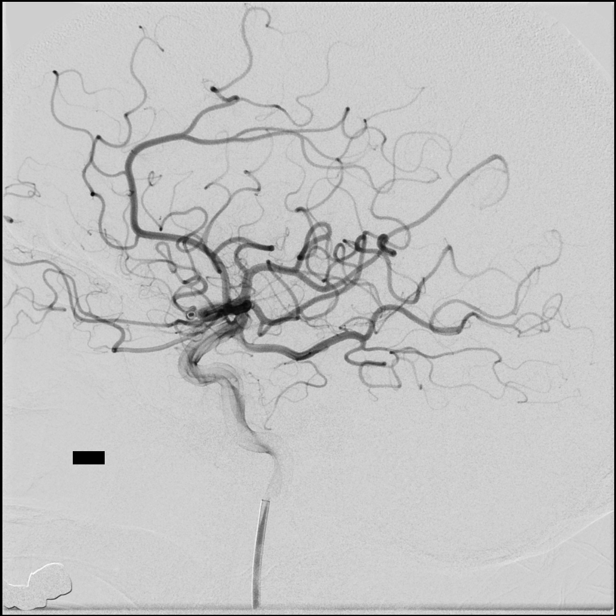
[im 137/144]
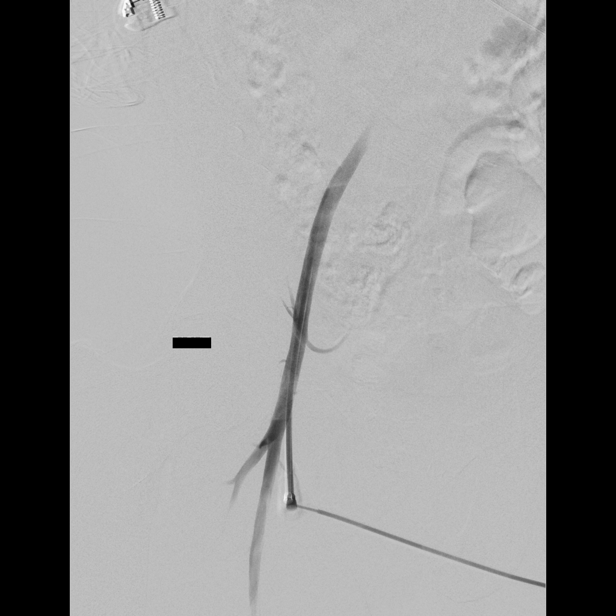

[11 of 24 positions shown; findings below may reference images not displayed]

Following a full explanation of the procedure along with the
potential associated complications, an informed witnessed consent
was obtained.

The patient was put under general anesthesia by the [REDACTED] at [HOSPITAL].

The right groin was prepped and draped in the usual sterile
fashion.  Using modified Seldinger technique, transfemoral access
into the right common femoral artery was obtained without
difficulty.  Over a 0.035-inch guidewire, a 5-French Pinnacle
sheath was inserted.  Through this and also over a 0.035-inch
guidewire, a 5-French JB1 catheter was advanced to the aortic arch
region and selectively positioned in the right common carotid
artery.  An arteriogram was perfor[REDACTED]ed over the carotid
bifurcation and intracranially.

There were no acute complications.  The patient tolerated the
procedure well.

Medications utilized as per general anesthesia.

Contrast: Omnipaque approximately 25 ml.
FINDINGS: The right common carotid arteriogram demonstrates the
right external carotid artery and its major branches to be normal.

The right internal carotid artery at the bulb to the cranial skull
base opacifies normally.

The petrous, cavernous and the supraclinoid segments are normal.  A
dominant right posterior communicating artery is seen to opacify
the right posterior cerebral artery distribution.

The right middle and the right anterior cerebral arteries opacify
normally into capillary and venous phases.  Again seen arising in
the distal M1 trunk of the right middle cerebral artery is a
saccular aneurysm projecting inferiorly.  This measures
approximately 3 mm x 2.5 mm.

ENDOVASCULAR COILING OF THE RIGHT MIDDLE CEREBRAL ARTERY ANEURYSM

The diagnostic JB1 catheter in the right common carotid artery was
exchanged over a 0.035-inch 300 cm Rosen exchange guidewire for a 6-
French 65 cm neurovascular sheath using biplane roadmap technique
and constant fluoroscopic guidance.  Good aspiration was obtained
from the hub of the neurovascular sheath.

A gentle contrast injection demonstrated no evidence of spasms,
dissections or of intraluminal filling defects.

This was then connected to continuous heparinized saline infusion.

Over the Rosen exchange guidewire, a 6-French 90 cm Envoy Brite Tip
guide catheter was then advanced and positioned just proximal to
the right common carotid bifurcation.  The guidewire was removed.
Good aspiration was obtained from the hub of the 6-French Brite Tip
guide catheter.

Again no spasms, dissections or intraluminal filling defects were
seen with a gentle contrast injection.

Over a 0.035-inch Roadrunner guidewire, the 6-French guide catheter
was advanced to the distal vertical segment of the right internal
carotid artery using biplane roadmap technique and constant
fluoroscopic guidance.  Good aspiration was obtained from the hub
of the 6-French guide catheter.

A gentle contrast injection again demonstrated no evidence of
spasms, dissections or of intraluminal filling defects.

A control arteriogram perfor[REDACTED]ed intracranially
demonstrated no change in the intracranial circulation.

At this time in a coaxial manner and with constant heparinized
saline infusion using biplane roadmap technique and constant
fluoroscopic guidance, an SL 10 two-tip microcatheter which had
been steam-shaped was then advanced over a 0.014-inch Softip
Synchro microguidewire.  With the microguidewire leading with a J-
tip configuration to avoid dissections or inducing spasm, the
combination was navigated to the distal end of the guide catheter.
The microguidewire was then manipulated using torque device into
the supraclinoid right ICA and into the right MCA and then
subsequently into the aneurysm followed by the microcatheter.  Once
the microcatheter was in the aneurysm, the microguidewire was
gently retrieved under constant fluoroscopic guidance ensuring no
sudden forward motion movements of the microcatheter.  None was
observed.

Good aspiration was obtained from the hub of the intra-aneurysmal
microcatheter.

This was then connected to continuous heparinized saline infusion.

At this time a 2 mm x 2 cm HydroSoft helical 10 coil was then
advanced to the distal end of the intra-aneurysmal microcatheter.

The coil was then advanced into the aneurysm under constant
fluoroscopic guidance using biplane roadmap technique.

Having achieved a nice configuration, this was then detached
without difficulty. A control arteriogram performed through the 6-
French guide catheter demonstrated near complete obliteration of
the aneurysm.

Advancement of additional coils was met with herniation of the
microcatheter into the parent vessel.

The microcatheter was then retrieved proximally.  Control
arteriograms were then performed at 15 and 30 minutes post coil
placement.  These continued to demonstrate near complete
obliteration of the aneurysm.

No change was seen in the configuration of the coil.

The microcatheter was then retrieved and removed.  A control
arteriogram performed through the 6-French guide catheter in the
right internal carotid artery demonstrated no evidence of
intraluminal filling defects.

The 6-French guide catheter and the 6-French neurovascular sheath
were then retrieved into the abdominal aorta and exchanged over a J-
tip guidewire for a 7-French Pinnacle sheath.

This was then exchanged successfully with an external closure
device.

The patient was given 10 mg of protamine sulfate to partially
reverse the heparin.

Throughout the procedure the patient's ACT was maintained in the
region of approximately 180 seconds.

There were no acute complications in terms of the patient's
neurological changes or hemodynamic status.

The patient's general anesthetic was reversed and the patient was
extubated.  Upon recovery the patient demonstrated no new
neurological signs or symptoms.

The patient was then transported to the Neuro ICU for overnight
close neurological observations, and to continue his IV heparin.

IMPRESSION
1.  Status post endovascular near complete obliteration of right
MCA aneurysm as described above.

The patient's overnight stay was unremarkable.  Towards the end of
the day of the procedure the patient was able to tolerate free
liquids.  The patient's IV heparin was stopped the following day.
He patient was started on aspirin 325 mg and one dose of Plavix 75
mg. The patient's right groin was soft without evidence of
hematoma.

The patient was able to tolerate his breakfast.  He was also able
to ambulate independently.

He was then discharged home under the care of his spouse with
special instructions.  He will return to the clinic for follow-up
in 2 weeks' time.

## 2014-02-20 HISTORY — PX: CATARACT EXTRACTION W/ INTRAOCULAR LENS  IMPLANT, BILATERAL: SHX1307

## 2016-12-26 ENCOUNTER — Other Ambulatory Visit: Payer: Self-pay | Admitting: Urology

## 2016-12-27 ENCOUNTER — Encounter (HOSPITAL_BASED_OUTPATIENT_CLINIC_OR_DEPARTMENT_OTHER): Payer: Self-pay | Admitting: *Deleted

## 2016-12-27 ENCOUNTER — Other Ambulatory Visit: Payer: Self-pay

## 2016-12-27 NOTE — Progress Notes (Signed)
NPO AFTER MN.  ARRIVE AT 0930.  NEEDS ISTAT 8 AND EKG.  WILL TAKE FLOMAX, CYMBALTA, AND PRAVASTATIN AM DOS W/ SIPS OF WATER.

## 2017-01-01 ENCOUNTER — Ambulatory Visit (HOSPITAL_BASED_OUTPATIENT_CLINIC_OR_DEPARTMENT_OTHER)
Admission: RE | Admit: 2017-01-01 | Discharge: 2017-01-01 | Disposition: A | Discharge status: Home / Self Care | Payer: 59 | Source: Ambulatory Visit | Attending: Urology | Admitting: Urology

## 2017-01-01 ENCOUNTER — Encounter (HOSPITAL_BASED_OUTPATIENT_CLINIC_OR_DEPARTMENT_OTHER)
Admission: RE | Disposition: A | Discharge status: Home / Self Care | Payer: Self-pay | Source: Ambulatory Visit | Attending: Urology

## 2017-01-01 ENCOUNTER — Encounter (HOSPITAL_BASED_OUTPATIENT_CLINIC_OR_DEPARTMENT_OTHER): Payer: Self-pay | Admitting: Anesthesiology

## 2017-01-01 ENCOUNTER — Other Ambulatory Visit: Payer: Self-pay

## 2017-01-01 ENCOUNTER — Ambulatory Visit (HOSPITAL_BASED_OUTPATIENT_CLINIC_OR_DEPARTMENT_OTHER): Payer: 59 | Admitting: Anesthesiology

## 2017-01-01 DIAGNOSIS — E785 Hyperlipidemia, unspecified: Secondary | ICD-10-CM | POA: Insufficient documentation

## 2017-01-01 DIAGNOSIS — E119 Type 2 diabetes mellitus without complications: Secondary | ICD-10-CM | POA: Insufficient documentation

## 2017-01-01 DIAGNOSIS — Z7984 Long term (current) use of oral hypoglycemic drugs: Secondary | ICD-10-CM | POA: Diagnosis not present

## 2017-01-01 DIAGNOSIS — I10 Essential (primary) hypertension: Secondary | ICD-10-CM | POA: Insufficient documentation

## 2017-01-01 DIAGNOSIS — N4889 Other specified disorders of penis: Secondary | ICD-10-CM | POA: Insufficient documentation

## 2017-01-01 DIAGNOSIS — Z79899 Other long term (current) drug therapy: Secondary | ICD-10-CM | POA: Diagnosis not present

## 2017-01-01 DIAGNOSIS — Z87891 Personal history of nicotine dependence: Secondary | ICD-10-CM | POA: Insufficient documentation

## 2017-01-01 DIAGNOSIS — Z7982 Long term (current) use of aspirin: Secondary | ICD-10-CM | POA: Diagnosis not present

## 2017-01-01 DIAGNOSIS — Z8619 Personal history of other infectious and parasitic diseases: Secondary | ICD-10-CM | POA: Insufficient documentation

## 2017-01-01 DIAGNOSIS — J449 Chronic obstructive pulmonary disease, unspecified: Secondary | ICD-10-CM | POA: Insufficient documentation

## 2017-01-01 DIAGNOSIS — N489 Disorder of penis, unspecified: Secondary | ICD-10-CM | POA: Diagnosis present

## 2017-01-01 HISTORY — DX: Type 2 diabetes mellitus without complications: E11.9

## 2017-01-01 HISTORY — DX: Personal history of other infectious and parasitic diseases: Z86.19

## 2017-01-01 HISTORY — DX: Other chronic pain: G89.29

## 2017-01-01 HISTORY — DX: Acne, unspecified: L70.9

## 2017-01-01 HISTORY — PX: PENILE BIOPSY: SHX6013

## 2017-01-01 HISTORY — DX: Benign prostatic hyperplasia with lower urinary tract symptoms: N40.1

## 2017-01-01 HISTORY — DX: Low back pain, unspecified: M54.50

## 2017-01-01 HISTORY — DX: Unspecified osteoarthritis, unspecified site: M19.90

## 2017-01-01 HISTORY — DX: Hyperlipidemia, unspecified: E78.5

## 2017-01-01 HISTORY — DX: Low back pain: M54.5

## 2017-01-01 HISTORY — DX: Other specified postprocedural states: Z98.890

## 2017-01-01 HISTORY — DX: Dyspnea, unspecified: R06.00

## 2017-01-01 HISTORY — DX: Chronic obstructive pulmonary disease, unspecified: J44.9

## 2017-01-01 HISTORY — DX: Other congenital malformations of spine, not associated with scoliosis: Q76.49

## 2017-01-01 HISTORY — DX: Other forms of dyspnea: R06.09

## 2017-01-01 HISTORY — DX: Personal history of other diseases of the digestive system: Z87.19

## 2017-01-01 HISTORY — DX: Disorder of penis, unspecified: N48.9

## 2017-01-01 HISTORY — DX: Personal history of other specified conditions: Z87.898

## 2017-01-01 LAB — POCT I-STAT, CHEM 8
BUN: 26 mg/dL — ABNORMAL HIGH (ref 6–20)
CALCIUM ION: 1.2 mmol/L (ref 1.15–1.40)
Chloride: 102 mmol/L (ref 101–111)
Creatinine, Ser: 1.1 mg/dL (ref 0.61–1.24)
GLUCOSE: 172 mg/dL — AB (ref 65–99)
HCT: 47 % (ref 39.0–52.0)
HEMOGLOBIN: 16 g/dL (ref 13.0–17.0)
POTASSIUM: 4.1 mmol/L (ref 3.5–5.1)
SODIUM: 140 mmol/L (ref 135–145)
TCO2: 25 mmol/L (ref 22–32)

## 2017-01-01 LAB — GLUCOSE, CAPILLARY: GLUCOSE-CAPILLARY: 150 mg/dL — AB (ref 65–99)

## 2017-01-01 SURGERY — BIOPSY, PENIS
Anesthesia: Monitor Anesthesia Care | Site: Penis

## 2017-01-01 MED ORDER — PROPOFOL 10 MG/ML IV BOLUS
INTRAVENOUS | Status: DC | PRN
  Administered 2017-01-01: 20 mg via INTRAVENOUS
  Administered 2017-01-01: 40 mg via INTRAVENOUS

## 2017-01-01 MED ORDER — PROPOFOL 500 MG/50ML IV EMUL
INTRAVENOUS | Status: AC
  Filled 2017-01-01: qty 50

## 2017-01-01 MED ORDER — HYDROMORPHONE HCL 1 MG/ML IJ SOLN
0.2500 mg | INTRAMUSCULAR | Status: DC | PRN
  Filled 2017-01-01: qty 0.5

## 2017-01-01 MED ORDER — CEFAZOLIN SODIUM-DEXTROSE 2-4 GM/100ML-% IV SOLN
2.0000 g | INTRAVENOUS | Status: AC
  Administered 2017-01-01: 2 g via INTRAVENOUS
  Filled 2017-01-01: qty 100

## 2017-01-01 MED ORDER — CEFAZOLIN SODIUM-DEXTROSE 2-4 GM/100ML-% IV SOLN
INTRAVENOUS | Status: AC
  Filled 2017-01-01: qty 100

## 2017-01-01 MED ORDER — BACITRACIN ZINC 500 UNIT/GM EX OINT
TOPICAL_OINTMENT | CUTANEOUS | Status: DC | PRN
  Administered 2017-01-01: 1 via TOPICAL

## 2017-01-01 MED ORDER — MIDAZOLAM HCL 2 MG/2ML IJ SOLN
INTRAMUSCULAR | Status: AC
  Filled 2017-01-01: qty 2

## 2017-01-01 MED ORDER — OXYCODONE HCL 5 MG PO TABS
5.0000 mg | ORAL_TABLET | Freq: Once | ORAL | Status: DC | PRN
  Filled 2017-01-01: qty 1

## 2017-01-01 MED ORDER — LIDOCAINE 2% (20 MG/ML) 5 ML SYRINGE
INTRAMUSCULAR | Status: AC
  Filled 2017-01-01: qty 5

## 2017-01-01 MED ORDER — FENTANYL CITRATE (PF) 100 MCG/2ML IJ SOLN
INTRAMUSCULAR | Status: AC
  Filled 2017-01-01: qty 2

## 2017-01-01 MED ORDER — OXYCODONE HCL 5 MG PO CAPS: 5.0000 mg | ORAL_CAPSULE | ORAL | 0 refills | Status: DC | PRN

## 2017-01-01 MED ORDER — PROPOFOL 10 MG/ML IV BOLUS
INTRAVENOUS | Status: AC
  Filled 2017-01-01: qty 40

## 2017-01-01 MED ORDER — DEXAMETHASONE SODIUM PHOSPHATE 10 MG/ML IJ SOLN
INTRAMUSCULAR | Status: AC
  Filled 2017-01-01: qty 1

## 2017-01-01 MED ORDER — PROMETHAZINE HCL 25 MG/ML IJ SOLN
6.2500 mg | INTRAMUSCULAR | Status: DC | PRN
  Filled 2017-01-01: qty 1

## 2017-01-01 MED ORDER — LACTATED RINGERS IV SOLN
INTRAVENOUS | Status: DC
  Administered 2017-01-01: 10:00:00 via INTRAVENOUS
  Filled 2017-01-01: qty 1000

## 2017-01-01 MED ORDER — LACTATED RINGERS IV SOLN
INTRAVENOUS | Status: DC
  Administered 2017-01-01: 12:00:00 via INTRAVENOUS
  Filled 2017-01-01: qty 1000

## 2017-01-01 MED ORDER — BUPIVACAINE HCL (PF) 0.25 % IJ SOLN
INTRAMUSCULAR | Status: DC | PRN
  Administered 2017-01-01: 10 mL

## 2017-01-01 MED ORDER — ONDANSETRON HCL 4 MG/2ML IJ SOLN
INTRAMUSCULAR | Status: AC
  Filled 2017-01-01: qty 2

## 2017-01-01 MED ORDER — MIDAZOLAM HCL 5 MG/5ML IJ SOLN
INTRAMUSCULAR | Status: DC | PRN
  Administered 2017-01-01: 2 mg via INTRAVENOUS

## 2017-01-01 MED ORDER — OXYCODONE HCL 5 MG/5ML PO SOLN
5.0000 mg | Freq: Once | ORAL | Status: DC | PRN
  Filled 2017-01-01: qty 5

## 2017-01-01 MED ORDER — PROPOFOL 500 MG/50ML IV EMUL
INTRAVENOUS | Status: DC | PRN
  Administered 2017-01-01: 50 ug/kg/min via INTRAVENOUS

## 2017-01-01 MED ORDER — MEPERIDINE HCL 25 MG/ML IJ SOLN
6.2500 mg | INTRAMUSCULAR | Status: DC | PRN
  Filled 2017-01-01: qty 1

## 2017-01-01 MED ORDER — FENTANYL CITRATE (PF) 100 MCG/2ML IJ SOLN
INTRAMUSCULAR | Status: DC | PRN
  Administered 2017-01-01 (×2): 50 ug via INTRAVENOUS

## 2017-01-01 SURGICAL SUPPLY — 25 items
BLADE SURG 15 STRL LF DISP TIS (BLADE) IMPLANT
BLADE SURG 15 STRL SS (BLADE) ×3
COVER BACK TABLE 60X90IN (DRAPES) ×2 IMPLANT
COVER MAYO STAND STRL (DRAPES) ×2 IMPLANT
DRAPE LAPAROTOMY 100X72 PEDS (DRAPES) ×2 IMPLANT
ELECT NDL TIP 2.8 STRL (NEEDLE) IMPLANT
ELECT NEEDLE TIP 2.8 STRL (NEEDLE) ×3 IMPLANT
ELECT REM PT RETURN 9FT ADLT (ELECTROSURGICAL) ×3
ELECTRODE REM PT RTRN 9FT ADLT (ELECTROSURGICAL) IMPLANT
GAUZE XEROFORM 1X8 LF (GAUZE/BANDAGES/DRESSINGS) ×2 IMPLANT
GLOVE BIO SURGEON STRL SZ 6.5 (GLOVE) ×1 IMPLANT
GLOVE BIO SURGEON STRL SZ7.5 (GLOVE) ×4 IMPLANT
GLOVE BIO SURGEONS STRL SZ 6.5 (GLOVE) ×1
GLOVE BIOGEL PI IND STRL 6.5 (GLOVE) IMPLANT
GLOVE BIOGEL PI INDICATOR 6.5 (GLOVE) ×2
GOWN STRL REUS W/TWL LRG LVL3 (GOWN DISPOSABLE) ×4 IMPLANT
NDL HYPO 25X1 1.5 SAFETY (NEEDLE) IMPLANT
NEEDLE HYPO 25X1 1.5 SAFETY (NEEDLE) ×3 IMPLANT
NS IRRIG 500ML POUR BTL (IV SOLUTION) ×2 IMPLANT
PACK BASIN DAY SURGERY FS (CUSTOM PROCEDURE TRAY) ×2 IMPLANT
PENCIL BUTTON HOLSTER BLD 10FT (ELECTRODE) ×2 IMPLANT
SYR CONTROL 10ML LL (SYRINGE) ×2 IMPLANT
TRAY DSU PREP LF (CUSTOM PROCEDURE TRAY) ×2 IMPLANT
TUBE CONNECTING 12'X1/4 (SUCTIONS) ×1
TUBE CONNECTING 12X1/4 (SUCTIONS) ×1 IMPLANT

## 2017-01-01 NOTE — Discharge Instructions (Signed)
°  Post Anesthesia Home Care Instructions  Activity: Get plenty of rest for the remainder of the day. A responsible individual must stay with you for 24 hours following the procedure.  For the next 24 hours, DO NOT: -Drive a car -Paediatric nurse -Drink alcoholic beverages -Take any medication unless instructed by your physician -Make any legal decisions or sign important papers.  Meals: Start with liquid foods such as gelatin or soup. Progress to regular foods as tolerated. Avoid greasy, spicy, heavy foods. If nausea and/or vomiting occur, drink only clear liquids until the nausea and/or vomiting subsides. Call your physician if vomiting continues.  Special Instructions/Symptoms: Your throat may feel dry or sore from the anesthesia or the breathing tube placed in your throat during surgery. If this causes discomfort, gargle with warm salt water. The discomfort should disappear within 24 hours.  If you had a scopolamine patch placed behind your ear for the management of post- operative nausea and/or vomiting:  1. The medication in the patch is effective for 72 hours, after which it should be removed.  Wrap patch in a tissue and discard in the trash. Wash hands thoroughly with soap and water. 2. You may remove the patch earlier than 72 hours if you experience unpleasant side effects which may include dry mouth, dizziness or visual disturbances. 3. Avoid touching the patch. Wash your hands with soap and water after contact with the patch.   1.  Activity:  You are encouraged to ambulate frequently (about every hour during waking hours) to help prevent blood clots from forming in your legs or lungs.  However, you should not engage in any heavy lifting (> 10-15 lbs), strenuous activity, or straining. 2. Diet: You should advance your diet as instructed by your physician.  It will be normal to have some bloating, nausea, and abdominal discomfort intermittently. 3. Prescriptions:  You will be  provided a prescription for pain medication to take as needed.  If your pain is not severe enough to require the prescription pain medication, you may take extra strength Tylenol instead which will have less side effects.  You should also take a prescribed stool softener to avoid straining with bowel movements as the prescription pain medication may constipate you. 4. Incisions: You may remove your dressing bandages 48 hours after surgery if not removed in the hospital.  You will either have some small staples or special tissue glue at each of the incision sites. Once the bandages are removed (if present), the incisions may stay open to air.  You may start showering (but not soaking or bathing in water) the 2nd day after surgery and the incisions simply need to be patted dry after the shower.  No additional care is needed. 5. What to call us about: You should call the office [phone]) if you develop fever > 101 or develop persistent vomiting.

## 2017-01-01 NOTE — Op Note (Signed)
Preoperative diagnosis: glans penile lesion  Postoperative diagnosis: Same  Procedure: 1. Penile punch biopsy 2. Penile block  Attending: Nicolette Bang, MD  Resident: Fredrik Rigger, MD  Anesthesia: MAC  History of blood loss: 5cc  Antibiotics: Ancef  Drains: None  Specimens: right glans lesion  Findings: erythematous lesion involving right glans approximately 1cm in size.  Indications: Patient is a 61 year old male with a history of an erythematous penile lesion that has failed to improve on medical therapy. We discussed the treatment options including observation versus biopsy and the patient elects for biopsy.  Procedure in detail: Prior to procedure consent was obtained.  Patient was brought to the operating room and a brief timeout was done to ensure correct patient, correct procedure, correct site.  MAC anesthesia was administered and patient was placed in supine position.  His genitalia and abdomen was then prepped and draped in usual sterile fashion.  Using a punch biopsy forceps a 2m full thickness biopsy was obtained. We then placed 2 interrupted 3-0 chromic sutures and 1 4-0 monocryl interrupted suture to obtain hemostasis. We then injected 0.25% marcaine at the base of the pubic bone to perform the penile block.  A dressing was then applied to the incision.  This then concluded the procedure which was well tolerated by the patient.  Complications: None  Condition: Stable, extubated, transferred to PACU.  Plan: Patient is to be discharged home.  He is to follow up in 1 week for wound check and pathology discussion.

## 2017-01-01 NOTE — Anesthesia Preprocedure Evaluation (Addendum)
Anesthesia Evaluation  Patient identified by MRN, date of birth, ID band Patient awake    Reviewed: Allergy & Precautions, NPO status , Patient's Chart, lab work & pertinent test results  Airway Mallampati: III  TM Distance: >3 FB Neck ROM: Full    Dental  (+) Teeth Intact, Dental Advisory Given   Pulmonary COPD, former smoker,    breath sounds clear to auscultation       Cardiovascular hypertension, Pt. on medications negative cardio ROS   Rhythm:Regular Rate:Normal     Neuro/Psych  Headaches,    GI/Hepatic GERD  ,(+) Hepatitis -, C  Endo/Other  diabetes, Type 2  Renal/GU negative Renal ROS     Musculoskeletal  (+) Arthritis ,   Abdominal   Peds  Hematology negative hematology ROS (+)   Anesthesia Other Findings Day of surgery medications reviewed with the patient.  Reproductive/Obstetrics                            Anesthesia Physical Anesthesia Plan  ASA: III  Anesthesia Plan: MAC   Post-op Pain Management:    Induction: Intravenous  PONV Risk Score and Plan: 3 and Ondansetron, Dexamethasone and Midazolam  Airway Management Planned: Natural Airway and Nasal Cannula  Additional Equipment:   Intra-op Plan:   Post-operative Plan: Extubation in OR  Informed Consent: I have reviewed the patients History and Physical, chart, labs and discussed the procedure including the risks, benefits and alternatives for the proposed anesthesia with the patient or authorized representative who has indicated his/her understanding and acceptance.   Dental advisory given  Plan Discussed with: CRNA  Anesthesia Plan Comments: (Pt consented for GA and MAC. Will discuss invasiveness of procedure with McKenzie. )       Anesthesia Quick Evaluation

## 2017-01-01 NOTE — Transfer of Care (Signed)
Immediate Anesthesia Transfer of Care Note  Patient: Tommy Rojas  Procedure(s) Performed: PENILE BIOPSY (N/A Penis)  Patient Location: PACU  Anesthesia Type:MAC  Level of Consciousness: awake and oriented  Airway & Oxygen Therapy: Patient Spontanous Breathing and Patient connected to nasal cannula oxygen  Post-op Assessment: Report given to RN  Post vital signs: Reviewed and stable  Last Vitals: 138/86, 84, 9, 96% Vitals:   01/01/17 0934 01/01/17 1218  BP: (!) 151/84   Pulse: 89   Resp: 16   Temp: 36.5 C 36.6 C  SpO2: 99%     Last Pain:  Vitals:   01/01/17 0934  TempSrc: Oral      Patients Stated Pain Goal: 7 (53/61/44 3154)  Complications: No apparent anesthesia complications

## 2017-01-01 NOTE — Anesthesia Postprocedure Evaluation (Signed)
Anesthesia Post Note  Patient: Tommy Rojas  Procedure(s) Performed: PENILE BIOPSY (N/A Penis)     Anesthesia Post Evaluation  Last Vitals:  Vitals:   01/01/17 1218 01/01/17 1230  BP: 136/86 128/81  Pulse: 85 84  Resp: 10 11  Temp: 36.6 C   SpO2: 96% 95%    Last Pain:  Vitals:   01/01/17 0934  TempSrc: Oral                 Lynda Rainwater

## 2017-01-01 NOTE — Anesthesia Procedure Notes (Signed)
Procedure Name: MAC Date/Time: 01/01/2017 12:08 PM Performed by: Bonney Aid, CRNA Pre-anesthesia Checklist: Patient identified, Emergency Drugs available, Suction available, Patient being monitored and Timeout performed Patient Re-evaluated:Patient Re-evaluated prior to induction Oxygen Delivery Method: Simple face mask Placement Confirmation: positive ETCO2

## 2017-01-01 NOTE — H&P (Signed)
Urology Admission H&P  Chief Complaint: penile lesion  History of Present Illness: Tommy Rojas is a 61yo with a glans lesions that has failed to improve with medical therapy  Past Medical History:  Diagnosis Date  . Acne    BACK OF HEAD TAKES MINOCYLCINE DAILY  . Arthritis    LUMBAR  . Chronic low back pain   . 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 COPD (chronic obstructive pulmonary disease) (HCC)    MILD  . Dyspnea on exertion   . Enlarged prostate with lower urinary tract symptoms (LUTS)   . GERD (gastroesophageal reflux disease)   . History of acute pancreatitis   . History of colonic diverticulitis    2 episodes in past  . History of positive hepatitis C    1990s completed treatment  . History of urinary retention   . Hyperlipidemia   . Hypertension   . Penile lesion   . S/P coil embolization of cerebral aneurysm 04-08-2012    dr s. deveshwar   right MCA aneursym  . Type 2 diabetes mellitus ([place])    Past Surgical History:  Procedure Laterality Date  . ANKLE SURGERY Left 2008 approx.   tendon repair  poss. via arthroscopy  . ANTERIOR CERVICAL DECOMP/DISCECTOMY FUSION  2005   C5-6  . CATARACT EXTRACTION W/ INTRAOCULAR LENS  IMPLANT, BILATERAL  2016  . CEREBRAL [place] , FOUR VESSEL  03-12-2012    dr s. Estanislado Pandy   right MCA aneursym  . LAPAROSCOPIC CHOLECYSTECTOMY  1990s  . MEATAL / URETHRAL REPAIR  age 4  . ROTATOR CUFF REPAIR Right 2008 approx.  . WRIST SURGERY Right 1979   ORIF     Home Medications:  Current Facility-Administered Medications  Medication Dose Route Frequency Provider Last Rate Last Dose  . ceFAZolin (ANCEF) IVPB 2g/100 mL premix  2 g Intravenous 30 min Pre-Op Asaf Elmquist, Candee Furbish, MD      . lactated ringers infusion   Intravenous Continuous Effie Berkshire, MD 50 mL/hr at 01/01/17 1027     Allergies: No Known Allergies  History reviewed. No pertinent family history. Social History:  reports that he quit smoking about 6 years  ago. His smoking use included cigarettes. He quit after 40.00 years of use. he has never used smokeless tobacco. He reports that he drinks about 3.6 oz of alcohol per week. He reports that he does not use drugs.  Review of Systems  All other systems reviewed and are negative.   Physical Exam:  Vital signs in last 24 hours: Temp:  [97.7 F (36.5 C)] 97.7 F (36.5 C) (11/12 0934) Pulse Rate:  [89] 89 (11/12 0934) Resp:  [16] 16 (11/12 0934) BP: (151)/(84) 151/84 (11/12 0934) SpO2:  [99 %] 99 % (11/12 0934) Weight:  [88 kg (194 lb)] 88 kg (194 lb) (11/12 0934) Physical Exam  Constitutional: He is oriented to person, place, and time. He appears well-developed and well-nourished.  HENT:  Head: Normocephalic and atraumatic.  Eyes: EOM are normal. Pupils are equal, round, and reactive to light.  Neck: Normal range of motion. No thyromegaly present.  Cardiovascular: Normal rate and regular rhythm.  Respiratory: Effort normal. No respiratory distress.  GI: Soft. He exhibits no distension.  Musculoskeletal: Normal range of motion. He exhibits no edema.  Neurological: He is alert and oriented to person, place, and time.  Skin: Skin is warm and dry.  Psychiatric: He has a normal mood and affect. His behavior is normal. Judgment  and thought content normal.    Laboratory Data:  Results for orders placed or performed during the hospital encounter of 01/01/17 (from the past 24 hour(s))  I-STAT, chem 8     Status: Abnormal   Collection Time: 01/01/17 10:33 AM  Result Value Ref Range   Sodium 140 135 - 145 mmol/L   Potassium 4.1 3.5 - 5.1 mmol/L   Chloride 102 101 - 111 mmol/L   BUN 26 (H) 6 - 20 mg/dL   Creatinine, Ser 1.10 0.61 - 1.24 mg/dL   Glucose, Bld 172 (H) 65 - 99 mg/dL   Calcium, Ion 1.20 1.15 - 1.40 mmol/L   TCO2 25 22 - 32 mmol/L   Hemoglobin 16.0 13.0 - 17.0 g/dL   HCT 47.0 39.0 - 52.0 %   No results found for this or any previous visit (from the past 240  hour(s)). Creatinine: Recent Labs    01/01/17 1033  CREATININE 1.10   Baseline Creatinine: 1.1  Impression/Assessment:  61yo with penile lesion  Plan:  The risks/benefits/alternatives ro penile biopsy was explained to the patient and he understands and wishes to proceed with biopsy  Nicolette Bang 01/01/2017, 11:51 AM

## 2017-01-02 ENCOUNTER — Encounter (HOSPITAL_BASED_OUTPATIENT_CLINIC_OR_DEPARTMENT_OTHER): Payer: Self-pay | Admitting: Urology

## 2017-03-23 ENCOUNTER — Other Ambulatory Visit: Payer: Self-pay | Admitting: Nephrology

## 2017-03-23 DIAGNOSIS — N179 Acute kidney failure, unspecified: Secondary | ICD-10-CM

## 2017-03-29 ENCOUNTER — Ambulatory Visit
Admission: RE | Admit: 2017-03-29 | Discharge: 2017-03-29 | Disposition: A | Discharge status: Home / Self Care | Payer: 59 | Source: Ambulatory Visit | Attending: Nephrology | Admitting: Nephrology

## 2017-03-29 DIAGNOSIS — N179 Acute kidney failure, unspecified: Secondary | ICD-10-CM

## 2017-10-10 ENCOUNTER — Other Ambulatory Visit: Payer: Self-pay | Admitting: Family Medicine

## 2017-10-10 DIAGNOSIS — M5412 Radiculopathy, cervical region: Secondary | ICD-10-CM

## 2017-11-03 ENCOUNTER — Other Ambulatory Visit: Payer: PRIVATE HEALTH INSURANCE

## 2017-11-07 ENCOUNTER — Other Ambulatory Visit: Payer: Self-pay

## 2018-03-21 DIAGNOSIS — Z23 Encounter for immunization: Secondary | ICD-10-CM | POA: Diagnosis not present

## 2018-03-21 DIAGNOSIS — N189 Chronic kidney disease, unspecified: Secondary | ICD-10-CM | POA: Diagnosis not present

## 2018-03-21 DIAGNOSIS — E782 Mixed hyperlipidemia: Secondary | ICD-10-CM | POA: Diagnosis not present

## 2018-03-21 DIAGNOSIS — E1122 Type 2 diabetes mellitus with diabetic chronic kidney disease: Secondary | ICD-10-CM | POA: Diagnosis not present

## 2018-03-21 DIAGNOSIS — I10 Essential (primary) hypertension: Secondary | ICD-10-CM | POA: Diagnosis not present

## 2018-04-25 DIAGNOSIS — D2261 Melanocytic nevi of right upper limb, including shoulder: Secondary | ICD-10-CM | POA: Diagnosis not present

## 2018-04-25 DIAGNOSIS — L219 Seborrheic dermatitis, unspecified: Secondary | ICD-10-CM | POA: Diagnosis not present

## 2018-04-25 DIAGNOSIS — D485 Neoplasm of uncertain behavior of skin: Secondary | ICD-10-CM | POA: Diagnosis not present

## 2018-04-25 DIAGNOSIS — L739 Follicular disorder, unspecified: Secondary | ICD-10-CM | POA: Diagnosis not present

## 2018-04-25 DIAGNOSIS — D225 Melanocytic nevi of trunk: Secondary | ICD-10-CM | POA: Diagnosis not present

## 2018-05-02 DIAGNOSIS — C609 Malignant neoplasm of penis, unspecified: Secondary | ICD-10-CM | POA: Diagnosis not present

## 2018-06-13 DIAGNOSIS — C609 Malignant neoplasm of penis, unspecified: Secondary | ICD-10-CM | POA: Diagnosis not present

## 2018-06-27 DIAGNOSIS — E1122 Type 2 diabetes mellitus with diabetic chronic kidney disease: Secondary | ICD-10-CM | POA: Diagnosis not present

## 2018-07-25 DIAGNOSIS — C609 Malignant neoplasm of penis, unspecified: Secondary | ICD-10-CM | POA: Diagnosis not present

## 2018-07-29 ENCOUNTER — Other Ambulatory Visit: Payer: Self-pay | Admitting: Urology

## 2018-07-29 ENCOUNTER — Encounter (HOSPITAL_COMMUNITY): Payer: Self-pay

## 2018-07-29 NOTE — Patient Instructions (Addendum)
Tommy Rojas     Your procedure is scheduled on: 08-02-2018  Report to [facility] Main  Entrance  Report to admitting at 79 AM   YOU NEED TO HAVE A COVID 19 TEST ON today., THIS TEST MUST BE DONE BEFORE SURGERY, COME TO [place].    Call this number if you have problems the morning of surgery [phone]    Remember: Do not eat food or drink liquids :After Midnight. BRUSH YOUR TEETH MORNING OF SURGERY AND RINSE YOUR MOUTH OUT, NO CHEWING GUM CANDY OR MINTS.     Take these medicines the morning of surgery with A SIP OF WATER: duloxetine (cymbalta), pravastatin, tamsulosin (flomax), cyclobenzaprine if needed  DO NOT TAKE ANY DIABETIC MEDICATIONS DAY OF YOUR SURGERY                   How to Manage Your Diabetes Before and After Surgery  Why is it important to control my blood sugar before and after surgery? . Improving blood sugar levels before and after surgery helps healing and can limit problems. . A way of improving blood sugar control is eating a healthy diet by: o  Eating less sugar and carbohydrates o  Increasing activity/exercise o  Talking with your doctor about reaching your blood sugar goals . High blood sugars (greater than 180 mg/dL) can raise your risk of infections and slow your recovery, so you will need to focus on controlling your diabetes during the weeks before surgery. . Make sure that the doctor who takes care of your diabetes knows about your planned surgery including the date and location.  How do I manage my blood sugar before surgery? . Check your blood sugar at least 4 times a day, starting 2 days before surgery, to make sure that the level is not too high or low. o Check your blood sugar the morning of your surgery when you wake up and every 2 hours until you get to the Short Stay unit. . If your blood sugar is less than 70 mg/dL, you will need to treat for low blood sugar: o Do not take  insulin. o Treat a low blood sugar (less than 70 mg/dL) with  cup of clear juice (cranberry or apple), 4 glucose tablets, OR glucose gel. o Recheck blood sugar in 15 minutes after treatment (to make sure it is greater than 70 mg/dL). If your blood sugar is not greater than 70 mg/dL on recheck, call [phone] for further instructions. . Report your blood sugar to the short stay nurse when you get to Short Stay.  . If you are admitted to the hospital after surgery: o Your blood sugar will be checked by the staff and you will probably be given insulin after surgery (instead of oral diabetes medicines) to make sure you have good blood sugar levels. o The goal for blood sugar control after surgery is 80-180 mg/dL.   WHAT DO I DO ABOUT MY DIABETES MEDICATION?  Marland Kitchen Do not take oral diabetes medicines (pills) the morning of surgery.  . THE DAY  BEFORE SURGERY TAKE MORNING DOSE OF GLIMPERIDE and take metformin as usual, hold jardiance,     . THE MORNING OF SURGERY DO NOT TAKE GLIMPERIDE, metformin or jardiance        Reviewed and Endorsed by [facility] Patient Education Committee, August 2015  You may not have any metal on your body including hair pins and              piercings  Do not wear jewelry, make-up, lotions, powders or perfumes, deodorant             Do not wear nail polish.  Do not shave  48 hours prior to surgery.              Men may shave face and neck.   Do not bring valuables to the hospital. [place].  Contacts, dentures or bridgework may not be worn into surgery.  Leave suitcase in the car. After surgery it may be brought to your room.     Patients discharged the day of surgery will not be allowed to drive home. IF YOU ARE HAVING SURGERY AND GOING HOME THE SAME DAY, YOU MUST HAVE AN ADULT TO DRIVE YOU HOME AND BE WITH YOU FOR 24 HOURS. YOU MAY GO HOME BY TAXI OR UBER OR ORTHERWISE, BUT AN ADULT MUST  ACCOMPANY YOU HOME AND STAY WITH YOU FOR 24 HOURS.  Name and phone number of your driver: karen wife cell [phone]  Special Instructions: N/A              Please read over the following fact sheets you were given: _____________________________________________________________________             [facility] - Preparing for Surgery Before surgery, you can play an important role.  Because skin is not sterile, your skin needs to be as free of germs as possible.  You can reduce the number of germs on your skin by washing with CHG (chlorahexidine gluconate) soap before surgery.  CHG is an antiseptic cleaner which kills germs and bonds with the skin to continue killing germs even after washing. Please DO NOT use if you have an allergy to CHG or antibacterial soaps.  If your skin becomes reddened/irritated stop using the CHG and inform your nurse when you arrive at Short Stay. Do not shave (including legs and underarms) for at least 48 hours prior to the first CHG shower.  You may shave your face/neck. Please follow these instructions carefully:  1.  Shower with CHG Soap the night before surgery and the  morning of Surgery.  2.  If you choose to wash your hair, wash your hair first as usual with your  normal  shampoo.  3.  After you shampoo, rinse your hair and body thoroughly to remove the  shampoo.                           4.  Use CHG as you would any other liquid soap.  You can apply chg directly  to the skin and wash                       Gently with a scrungie or clean washcloth.  5.  Apply the CHG Soap to your body ONLY FROM THE NECK DOWN.   Do not use on face/ open                           Wound or open sores. Avoid contact with eyes, ears mouth and genitals (private parts).  Wash face,  Genitals (private parts) with your normal soap.             6.  Wash thoroughly, paying special attention to the area where your surgery  will be performed.  7.  Thoroughly rinse your  body with warm water from the neck down.  8.  DO NOT shower/wash with your normal soap after using and rinsing off  the CHG Soap.                9.  Pat yourself dry with a clean towel.            10.  Wear clean pajamas.            11.  Place clean sheets on your bed the night of your first shower and do not  sleep with pets. Day of Surgery : Do not apply any lotions/deodorants the morning of surgery.  Please wear clean clothes to the hospital/surgery center.  FAILURE TO FOLLOW THESE INSTRUCTIONS MAY RESULT IN THE CANCELLATION OF YOUR SURGERY PATIENT SIGNATURE_________________________________  NURSE SIGNATURE__________________________________  ________________________________________________________________________

## 2018-07-30 ENCOUNTER — Other Ambulatory Visit: Payer: Self-pay

## 2018-07-30 ENCOUNTER — Other Ambulatory Visit (HOSPITAL_COMMUNITY)
Admission: RE | Admit: 2018-07-30 | Discharge: 2018-07-30 | Disposition: A | Discharge status: Home / Self Care | Payer: BC Managed Care – PPO | Source: Ambulatory Visit | Attending: Urology | Admitting: Urology

## 2018-07-30 ENCOUNTER — Encounter (HOSPITAL_COMMUNITY)
Admission: RE | Admit: 2018-07-30 | Discharge: 2018-07-30 | Disposition: A | Discharge status: Home / Self Care | Payer: BC Managed Care – PPO | Source: Ambulatory Visit | Attending: Urology | Admitting: Urology

## 2018-07-30 ENCOUNTER — Encounter (HOSPITAL_COMMUNITY): Payer: Self-pay

## 2018-07-30 DIAGNOSIS — R9431 Abnormal electrocardiogram [ECG] [EKG]: Secondary | ICD-10-CM | POA: Insufficient documentation

## 2018-07-30 DIAGNOSIS — Z01818 Encounter for other preprocedural examination: Secondary | ICD-10-CM | POA: Insufficient documentation

## 2018-07-30 DIAGNOSIS — Z1159 Encounter for screening for other viral diseases: Secondary | ICD-10-CM | POA: Diagnosis not present

## 2018-07-30 DIAGNOSIS — E119 Type 2 diabetes mellitus without complications: Secondary | ICD-10-CM | POA: Diagnosis not present

## 2018-07-30 DIAGNOSIS — I10 Essential (primary) hypertension: Secondary | ICD-10-CM | POA: Insufficient documentation

## 2018-07-30 LAB — GLUCOSE, CAPILLARY: Glucose-Capillary: 157 mg/dL — ABNORMAL HIGH (ref 70–99)

## 2018-07-30 LAB — COMPREHENSIVE METABOLIC PANEL
ALT: 29 U/L (ref 0–44)
AST: 19 U/L (ref 15–41)
Albumin: 4.7 g/dL (ref 3.5–5.0)
Alkaline Phosphatase: 74 U/L (ref 38–126)
Anion gap: 8 (ref 5–15)
BUN: 28 mg/dL — ABNORMAL HIGH (ref 8–23)
CO2: 22 mmol/L (ref 22–32)
Calcium: 9.5 mg/dL (ref 8.9–10.3)
Chloride: 107 mmol/L (ref 98–111)
Creatinine, Ser: 1.16 mg/dL (ref 0.61–1.24)
GFR calc Af Amer: >60 mL/min (ref >60)
GFR calc non Af Amer: >60 mL/min (ref >60)
Glucose, Bld: 148 mg/dL — ABNORMAL HIGH (ref 70–99)
Potassium: 4.1 mmol/L (ref 3.5–5.1)
Sodium: 137 mmol/L (ref 135–145)
Total Bilirubin: 0.7 mg/dL (ref 0.3–1.2)
Total Protein: 7.9 g/dL (ref 6.5–8.1)

## 2018-07-30 LAB — CBC
HCT: 49.9 % (ref 39.0–52.0)
Hemoglobin: 16.5 g/dL (ref 13.0–17.0)
MCH: 29.5 pg (ref 26.0–34.0)
MCHC: 33.1 g/dL (ref 30.0–36.0)
MCV: 89.1 fL (ref 80.0–100.0)
Platelets: 173 10*3/uL (ref 150–400)
RBC: 5.6 MIL/uL (ref 4.22–5.81)
RDW: 12.3 % (ref 11.5–15.5)
WBC: 5.8 10*3/uL (ref 4.0–10.5)
nRBC: 0 % (ref 0.0–0.2)

## 2018-07-30 LAB — HEMOGLOBIN A1C
Hgb A1c MFr Bld: 7.9 % — ABNORMAL HIGH (ref 4.8–5.6)
Mean Plasma Glucose: 180.03 mg/dL

## 2018-07-31 LAB — NOVEL CORONAVIRUS, NAA (HOSP ORDER, SEND-OUT TO REF LAB; TAT 18-24 HRS): SARS-CoV-2, NAA: NOT DETECTED

## 2018-08-01 NOTE — Progress Notes (Signed)
SPOKE W/  Pt. Tommy Rojas via phone on the prescreening questions:   SCREENING SYMPTOMS OF COVID 19:  COUGH--no  RUNNY NOSE---no   SORE THROAT---no  NASAL CONGESTION----no  SNEEZING----allergy/ seasonal  SHORTNESS OF BREATH---no  DIFFICULTY BREATHING---no  TEMP >100.0 -----no  UNEXPLAINED BODY ACHES------no  CHILLS -------- no  HEADACHES ---------no  LOSS OF SMELL/ TASTE --------no   HAVE YOU OR ANY FAMILY MEMBER TRAVELLED PAST 14 DAYS OUT OF THE   COUNTY---no STATE----no COUNTRY----no  HAVE YOU OR ANY FAMILY MEMBER BEEN EXPOSED TO ANYONE WITH COVID 19?  no

## 2018-08-02 ENCOUNTER — Ambulatory Visit (HOSPITAL_COMMUNITY): Payer: BC Managed Care – PPO | Admitting: Physician Assistant

## 2018-08-02 ENCOUNTER — Encounter (HOSPITAL_COMMUNITY): Payer: Self-pay | Admitting: *Deleted

## 2018-08-02 ENCOUNTER — Ambulatory Visit (HOSPITAL_COMMUNITY)
Admission: RE | Admit: 2018-08-02 | Discharge: 2018-08-02 | Disposition: A | Discharge status: Home / Self Care | Payer: BC Managed Care – PPO | Source: Other Acute Inpatient Hospital | Attending: Urology | Admitting: Urology

## 2018-08-02 ENCOUNTER — Encounter (HOSPITAL_COMMUNITY)
Admission: RE | Disposition: A | Discharge status: Home / Self Care | Payer: Self-pay | Source: Other Acute Inpatient Hospital | Attending: Urology

## 2018-08-02 ENCOUNTER — Other Ambulatory Visit: Payer: Self-pay

## 2018-08-02 ENCOUNTER — Ambulatory Visit (HOSPITAL_COMMUNITY): Payer: BC Managed Care – PPO | Admitting: Anesthesiology

## 2018-08-02 DIAGNOSIS — Z79899 Other long term (current) drug therapy: Secondary | ICD-10-CM | POA: Diagnosis not present

## 2018-08-02 DIAGNOSIS — R51 Headache: Secondary | ICD-10-CM | POA: Diagnosis not present

## 2018-08-02 DIAGNOSIS — N481 Balanitis: Secondary | ICD-10-CM | POA: Diagnosis not present

## 2018-08-02 DIAGNOSIS — C609 Malignant neoplasm of penis, unspecified: Secondary | ICD-10-CM | POA: Diagnosis not present

## 2018-08-02 DIAGNOSIS — I10 Essential (primary) hypertension: Secondary | ICD-10-CM | POA: Diagnosis not present

## 2018-08-02 DIAGNOSIS — N4829 Other inflammatory disorders of penis: Secondary | ICD-10-CM | POA: Insufficient documentation

## 2018-08-02 DIAGNOSIS — J449 Chronic obstructive pulmonary disease, unspecified: Secondary | ICD-10-CM | POA: Insufficient documentation

## 2018-08-02 DIAGNOSIS — Z87891 Personal history of nicotine dependence: Secondary | ICD-10-CM | POA: Insufficient documentation

## 2018-08-02 DIAGNOSIS — E119 Type 2 diabetes mellitus without complications: Secondary | ICD-10-CM | POA: Insufficient documentation

## 2018-08-02 DIAGNOSIS — E785 Hyperlipidemia, unspecified: Secondary | ICD-10-CM | POA: Diagnosis not present

## 2018-08-02 DIAGNOSIS — D4959 Neoplasm of unspecified behavior of other genitourinary organ: Secondary | ICD-10-CM | POA: Diagnosis not present

## 2018-08-02 DIAGNOSIS — N489 Disorder of penis, unspecified: Secondary | ICD-10-CM | POA: Diagnosis not present

## 2018-08-02 HISTORY — PX: PENILE BIOPSY: SHX6013

## 2018-08-02 HISTORY — DX: Dyspnea, unspecified: R06.00

## 2018-08-02 LAB — GLUCOSE, CAPILLARY
Glucose-Capillary: 136 mg/dL — ABNORMAL HIGH (ref 70–99)
Glucose-Capillary: 100 mg/dL — ABNORMAL HIGH (ref 70–99)

## 2018-08-02 SURGERY — BIOPSY, PENIS
Anesthesia: General

## 2018-08-02 MED ORDER — MIDAZOLAM HCL 2 MG/2ML IJ SOLN
INTRAMUSCULAR | Status: AC
  Filled 2018-08-02: qty 2

## 2018-08-02 MED ORDER — 0.9 % SODIUM CHLORIDE (POUR BTL) OPTIME
TOPICAL | Status: DC | PRN
  Administered 2018-08-02: 1000 mL

## 2018-08-02 MED ORDER — OXYCODONE HCL 5 MG/5ML PO SOLN: 5.0000 mg | Freq: Once | ORAL | Status: DC | PRN

## 2018-08-02 MED ORDER — CEFAZOLIN SODIUM-DEXTROSE 2-4 GM/100ML-% IV SOLN
INTRAVENOUS | Status: AC
  Filled 2018-08-02: qty 100

## 2018-08-02 MED ORDER — FENTANYL CITRATE (PF) 100 MCG/2ML IJ SOLN
INTRAMUSCULAR | Status: AC
  Filled 2018-08-02: qty 2

## 2018-08-02 MED ORDER — LIDOCAINE 2% (20 MG/ML) 5 ML SYRINGE
INTRAMUSCULAR | Status: DC | PRN
  Administered 2018-08-02: 80 mg via INTRAVENOUS

## 2018-08-02 MED ORDER — ACETAMINOPHEN 325 MG PO TABS: 325.0000 mg | ORAL_TABLET | ORAL | Status: DC | PRN

## 2018-08-02 MED ORDER — BACITRACIN ZINC 500 UNIT/GM EX OINT
TOPICAL_OINTMENT | CUTANEOUS | Status: DC | PRN
  Administered 2018-08-02: 1 via TOPICAL

## 2018-08-02 MED ORDER — FENTANYL CITRATE (PF) 100 MCG/2ML IJ SOLN
INTRAMUSCULAR | Status: DC | PRN
  Administered 2018-08-02: 50 ug via INTRAVENOUS

## 2018-08-02 MED ORDER — MIDAZOLAM HCL 2 MG/2ML IJ SOLN
INTRAMUSCULAR | Status: DC | PRN
  Administered 2018-08-02: 2 mg via INTRAVENOUS

## 2018-08-02 MED ORDER — MEPERIDINE HCL 50 MG/ML IJ SOLN: 6.2500 mg | INTRAMUSCULAR | Status: DC | PRN

## 2018-08-02 MED ORDER — LACTATED RINGERS IV SOLN
INTRAVENOUS | Status: DC
  Administered 2018-08-02: 11:00:00 via INTRAVENOUS

## 2018-08-02 MED ORDER — BACITRACIN ZINC 500 UNIT/GM EX OINT
TOPICAL_OINTMENT | CUTANEOUS | Status: AC
  Filled 2018-08-02: qty 28.35

## 2018-08-02 MED ORDER — DEXAMETHASONE SODIUM PHOSPHATE 4 MG/ML IJ SOLN
INTRAMUSCULAR | Status: DC | PRN
  Administered 2018-08-02: 8 mg via INTRAVENOUS

## 2018-08-02 MED ORDER — BUPIVACAINE HCL (PF) 0.25 % IJ SOLN
INTRAMUSCULAR | Status: AC
  Filled 2018-08-02: qty 30

## 2018-08-02 MED ORDER — PROPOFOL 10 MG/ML IV BOLUS
INTRAVENOUS | Status: DC | PRN
  Administered 2018-08-02: 200 mg via INTRAVENOUS

## 2018-08-02 MED ORDER — ONDANSETRON HCL 4 MG/2ML IJ SOLN
INTRAMUSCULAR | Status: DC | PRN
  Administered 2018-08-02: 4 mg via INTRAVENOUS

## 2018-08-02 MED ORDER — FENTANYL CITRATE (PF) 100 MCG/2ML IJ SOLN: 25.0000 ug | INTRAMUSCULAR | Status: DC | PRN

## 2018-08-02 MED ORDER — ACETAMINOPHEN 160 MG/5ML PO SOLN: 325.0000 mg | ORAL | Status: DC | PRN

## 2018-08-02 MED ORDER — PROPOFOL 10 MG/ML IV BOLUS
INTRAVENOUS | Status: AC
  Filled 2018-08-02: qty 20

## 2018-08-02 MED ORDER — ONDANSETRON HCL 4 MG/2ML IJ SOLN
INTRAMUSCULAR | Status: AC
  Filled 2018-08-02: qty 2

## 2018-08-02 MED ORDER — LIDOCAINE HCL URETHRAL/MUCOSAL 2 % EX GEL
CUTANEOUS | Status: AC
  Filled 2018-08-02: qty 5

## 2018-08-02 MED ORDER — BUPIVACAINE HCL (PF) 0.25 % IJ SOLN
INTRAMUSCULAR | Status: DC | PRN
  Administered 2018-08-02: 10 mL

## 2018-08-02 MED ORDER — PHENYLEPHRINE 40 MCG/ML (10ML) SYRINGE FOR IV PUSH (FOR BLOOD PRESSURE SUPPORT)
PREFILLED_SYRINGE | INTRAVENOUS | Status: AC
  Filled 2018-08-02: qty 10

## 2018-08-02 MED ORDER — OXYCODONE HCL 5 MG PO TABS: 5.0000 mg | ORAL_TABLET | Freq: Once | ORAL | Status: DC | PRN

## 2018-08-02 MED ORDER — CEFAZOLIN SODIUM-DEXTROSE 2-4 GM/100ML-% IV SOLN
2.0000 g | INTRAVENOUS | Status: AC
  Administered 2018-08-02: 2 g via INTRAVENOUS

## 2018-08-02 MED ORDER — ONDANSETRON HCL 4 MG/2ML IJ SOLN: 4.0000 mg | Freq: Once | INTRAMUSCULAR | Status: DC | PRN

## 2018-08-02 MED ORDER — OXYCODONE-ACETAMINOPHEN 5-325 MG PO TABS: 1.0000 | ORAL_TABLET | ORAL | 0 refills | Status: AC | PRN

## 2018-08-02 SURGICAL SUPPLY — 24 items
BLADE SURG 15 STRL LF DISP TIS (BLADE) ×1 IMPLANT
BLADE SURG 15 STRL SS (BLADE) ×3
BNDG COHESIVE 1X5 TAN STRL LF (GAUZE/BANDAGES/DRESSINGS) ×1 IMPLANT
BNDG CONFORM 2 STRL LF (GAUZE/BANDAGES/DRESSINGS) ×1 IMPLANT
COVER SURGICAL LIGHT HANDLE (MISCELLANEOUS) ×3 IMPLANT
COVER WAND RF STERILE (DRAPES) IMPLANT
DRAPE LAPAROTOMY T 102X78X121 (DRAPES) ×3 IMPLANT
ELECT NDL TIP 2.8 STRL (NEEDLE) IMPLANT
ELECT NEEDLE TIP 2.8 STRL (NEEDLE) IMPLANT
ELECT PENCIL ROCKER SW 15FT (MISCELLANEOUS) ×3 IMPLANT
ELECT REM PT RETURN 15FT ADLT (MISCELLANEOUS) ×3 IMPLANT
GAUZE 4X4 16PLY RFD (DISPOSABLE) ×3 IMPLANT
GAUZE PETROLATUM 1 X8 (GAUZE/BANDAGES/DRESSINGS) ×1 IMPLANT
GAUZE SPONGE 4X4 12PLY STRL (GAUZE/BANDAGES/DRESSINGS) ×2 IMPLANT
GLOVE BIOGEL M 8.0 STRL (GLOVE) ×3 IMPLANT
GOWN STRL REUS W/TWL XL LVL3 (GOWN DISPOSABLE) ×3 IMPLANT
KIT BASIN OR (CUSTOM PROCEDURE TRAY) ×3 IMPLANT
KIT TURNOVER KIT A (KITS) IMPLANT
NEEDLE HYPO 22GX1.5 SAFETY (NEEDLE) ×2 IMPLANT
NS IRRIG 1000ML POUR BTL (IV SOLUTION) IMPLANT
PACK BASIC VI WITH GOWN DISP (CUSTOM PROCEDURE TRAY) ×3 IMPLANT
PUNCH BIOPSY 3 (MISCELLANEOUS) ×2 IMPLANT
SUT CHROMIC 4 0 RB 1X27 (SUTURE) ×4 IMPLANT
SYR CONTROL 10ML LL (SYRINGE) ×2 IMPLANT

## 2018-08-02 NOTE — H&P (Signed)
Urology Admission H&P  Chief Complaint: penile lesions  History of Present Illness: Mr Tommy Rojas is a 63yo with a hx of CIS of the glans who has worsening of his penile lesions with imiquimod therapy. No fevers/chills/sweats. No anusea vomtiting  Past Medical History:  Diagnosis Date  . Acne    BACK OF HEAD   . Arthritis    LUMBAR  . Chronic low back pain   . 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 COPD (chronic obstructive pulmonary disease) (HCC)    MILD  . Dyspnea    on exertion  . Dyspnea on exertion   . Enlarged prostate with lower urinary tract symptoms (LUTS)   . GERD (gastroesophageal reflux disease)   . History of acute pancreatitis   . History of colonic diverticulitis    2 episodes in past  . History of positive hepatitis C    1990s completed treatment  . History of urinary retention   . Hyperlipidemia   . Hypertension   . Penile lesion   . S/P coil embolization of cerebral aneurysm 04-08-2012    dr s. deveshwar   right MCA aneursym  . Type 2 diabetes mellitus ([place])    Past Surgical History:  Procedure Laterality Date  . ANKLE SURGERY Left 2008 approx.   tendon repair  poss. via arthroscopy  . ANTERIOR CERVICAL DECOMP/DISCECTOMY FUSION  2005   C5-6 with bone graft  . CATARACT EXTRACTION W/ INTRAOCULAR LENS  IMPLANT, BILATERAL  2016  . CEREBRAL [place] , FOUR VESSEL  03-12-2012    dr s. Estanislado Pandy   right MCA aneursym  . LAPAROSCOPIC CHOLECYSTECTOMY  1990s  . MEATAL / URETHRAL REPAIR  age 31  . PENILE BIOPSY N/A 01/01/2017   Procedure: PENILE BIOPSY;  Surgeon: Cleon Gustin, MD;  [facility];  Service: Urology;  Laterality: N/A;  . RADIOLOGY WITH ANESTHESIA N/A 04/08/2012   Procedure: RADIOLOGY WITH ANESTHESIA;  Surgeon: Rob Hickman, MD;  Location: [place];  Service: Radiology;  Laterality: N/A;   Right common carotid anteriogram w/ right MCA coiling aneursym  . ROTATOR CUFF REPAIR Right 2008 approx.  . WRIST  SURGERY Right 1979   ORIF     Home Medications:  Current Facility-Administered Medications  Medication Dose Route Frequency Provider Last Rate Last Dose  . ceFAZolin (ANCEF) 2-4 GM/100ML-% IVPB           . ceFAZolin (ANCEF) IVPB 2g/100 mL premix  2 g Intravenous 30 min Pre-Op Alyson Ingles Candee Furbish, MD      . lactated ringers infusion   Intravenous Continuous Janeece Riggers, MD 100 mL/hr at 08/02/18 1124     Allergies: No Known Allergies  History reviewed. No pertinent family history. Social History:  reports that he quit smoking about 8 years ago. His smoking use included cigarettes. He has a 20.00 pack-year smoking history. He has never used smokeless tobacco. He reports current alcohol use. He reports that he does not use drugs.  Review of Systems  All other systems reviewed and are negative.   Physical Exam:  Vital signs in last 24 hours: Temp:  [98.5 F (36.9 C)] 98.5 F (36.9 C) (06/12 1058) Pulse Rate:  [79] 79 (06/12 1058) BP: (131)/(83) 131/83 (06/12 1058) SpO2:  [100 %] 100 % (06/12 1058) Physical Exam  Constitutional: He is oriented to person, place, and time. He appears well-developed and well-nourished.  HENT:  Head: Normocephalic and atraumatic.  Eyes: Pupils are equal, round, and  reactive to light. EOM are normal.  Neck: Normal range of motion. No thyromegaly present.  Cardiovascular: Normal rate and regular rhythm.  Respiratory: Effort normal. No respiratory distress.  GI: Soft. He exhibits no distension.  Musculoskeletal: Normal range of motion.        General: No edema.  Neurological: He is alert and oriented to person, place, and time.  Skin: Skin is warm and dry.  Psychiatric: He has a normal mood and affect. His behavior is normal. Judgment and thought content normal.    Laboratory Data:  Results for orders placed or performed during the hospital encounter of 08/02/18 (from the past 24 hour(s))  Glucose, capillary     Status: Abnormal   Collection  Time: 08/02/18 10:55 AM  Result Value Ref Range   Glucose-Capillary 136 (H) 70 - 99 mg/dL   Comment 1 Notify RN    Recent Results (from the past 240 hour(s))  Novel Coronavirus, NAA (hospital order; send-out to ref lab)     Status: None   Collection Time: 07/30/18  9:41 AM   Specimen: Nasopharyngeal Swab; Respiratory  Result Value Ref Range Status   SARS-CoV-2, NAA NOT DETECTED NOT DETECTED Final    Comment: (NOTE) This test was developed and its performance characteristics determined by [employer]. This test has not been FDA cleared or approved. This test has been authorized by FDA under an Emergency Use Authorization (EUA). This test is only authorized for the duration of time the declaration that circumstances exist justifying the authorization of the emergency use of in vitro diagnostic tests for detection of SARS-CoV-2 virus and/or diagnosis of COVID-19 infection under section 564(b)(1) of the Act, 21 U.S.C. 031RXY-5(O)(5), unless the authorization is terminated or revoked sooner. When diagnostic testing is negative, the possibility of a false negative result should be considered in the context of a patient's recent exposures and the presence of clinical signs and symptoms consistent with COVID-19. An individual without symptoms of COVID-19 and who is not shedding SARS-CoV-2 virus would expect to have a negative (not detected) result in this assay. Performed  At: [facility] [address] [place], [place] [id number] Rush Farmer MD [place]:[id number]    [place]  Final    Comment: Performed at [facility], [place] [address], [place], [place] [address]   Creatinine: Recent Labs    07/30/18 0858  CREATININE 1.16   Baseline Creatinine: 1.1  Impression/Assessment:  62yo with penile lesions  Plan:  The risks/benefits/alternatives to penile biopsy was explained to the patient and she understands and wishes to proceed with  surgery  Nicolette Bang 08/02/2018, 12:31 PM

## 2018-08-02 NOTE — Anesthesia Preprocedure Evaluation (Addendum)
Anesthesia Evaluation  Patient identified by MRN, date of birth, ID band Patient awake    Reviewed: Allergy & Precautions, NPO status , Patient's Chart, lab work & pertinent test results, reviewed documented beta blocker date and time   Airway Mallampati: III  TM Distance: >3 FB Neck ROM: Full    Dental  (+) Teeth Intact, Dental Advisory Given   Pulmonary COPD, former smoker,    breath sounds clear to auscultation       Cardiovascular hypertension, Pt. on medications negative cardio ROS   Rhythm:Regular Rate:Normal     Neuro/Psych  Headaches,    GI/Hepatic (+) Hepatitis -, C  Endo/Other  diabetes, Type 2  Renal/GU negative Renal ROS     Musculoskeletal  (+) Arthritis ,   Abdominal   Peds  Hematology negative hematology ROS (+)   Anesthesia Other Findings Day of surgery medications reviewed with the patient.  Reproductive/Obstetrics                           Anesthesia Physical  Anesthesia Plan  ASA: III  Anesthesia Plan: General   Post-op Pain Management:    Induction: Intravenous  PONV Risk Score and Plan: 3 and Ondansetron, Dexamethasone and Midazolam  Airway Management Planned: Oral ETT and LMA  Additional Equipment:   Intra-op Plan:   Post-operative Plan: Extubation in OR  Informed Consent: I have reviewed the patients History and Physical, chart, labs and discussed the procedure including the risks, benefits and alternatives for the proposed anesthesia with the patient or authorized representative who has indicated his/her understanding and acceptance.     Dental advisory given  Plan Discussed with: CRNA, Anesthesiologist and Surgeon  Anesthesia Plan Comments:        Anesthesia Quick Evaluation

## 2018-08-02 NOTE — Transfer of Care (Signed)
Immediate Anesthesia Transfer of Care Note  Patient: Tommy Rojas  Procedure(s) Performed: PENILE BIOPSY (N/A )  Patient Location: PACU  Anesthesia Type:General  Level of Consciousness: awake, alert , oriented and patient cooperative  Airway & Oxygen Therapy: Patient Spontanous Breathing and Patient connected to face mask oxygen  Post-op Assessment: Report given to RN, Post -op Vital signs reviewed and stable and Patient moving all extremities  Post vital signs: Reviewed and stable  Last Vitals:  Vitals Value Taken Time  BP 129/80 08/02/18 1348  Temp    Pulse 75 08/02/18 1349  Resp 9 08/02/18 1349  SpO2 100 % 08/02/18 1349  Vitals shown include unvalidated device data.  Last Pain:  Vitals:   08/02/18 1114  TempSrc:   PainSc: 0-No pain         Complications: No apparent anesthesia complications

## 2018-08-02 NOTE — Anesthesia Procedure Notes (Signed)
Procedure Name: LMA Insertion Date/Time: 08/02/2018 1:07 PM Performed by: Victoriano Lain, CRNA Pre-anesthesia Checklist: Patient identified, Emergency Drugs available, Suction available, Patient being monitored and Timeout performed Patient Re-evaluated:Patient Re-evaluated prior to induction Oxygen Delivery Method: Circle system utilized Preoxygenation: Pre-oxygenation with 100% oxygen Induction Type: IV induction Ventilation: Mask ventilation without difficulty LMA: LMA inserted LMA Size: 4.0 Number of attempts: 1 Placement Confirmation: positive ETCO2 and breath sounds checked- equal and bilateral Tube secured with: Tape Dental Injury: Teeth and Oropharynx as per pre-operative assessment

## 2018-08-02 NOTE — Brief Op Note (Signed)
08/02/2018  1:34 PM  PATIENT:  Tommy Rojas  63 y.o. male  PRE-OPERATIVE DIAGNOSIS:  PENILE CANCER  POST-OPERATIVE DIAGNOSIS:  PENILE CANCER  PROCEDURE:  Procedure(s) with comments: PENILE BIOPSY (N/A) - 30 MINS  SURGEON:  Surgeon(s) and Role:    * Dalyah Pla, Candee Furbish, MD - Primary  PHYSICIAN ASSISTANT:   ASSISTANTS: none   ANESTHESIA:   general  EBL:  minimal   BLOOD ADMINISTERED:none  DRAINS: none   LOCAL MEDICATIONS USED:  MARCAINE     SPECIMEN:  Source of Specimen:  glans and penile shaft  DISPOSITION OF SPECIMEN:  PATHOLOGY  COUNTS:  YES  TOURNIQUET:  * No tourniquets in log *  DICTATION: .Note written in EPIC  PLAN OF CARE: Discharge to home after PACU  PATIENT DISPOSITION:  PACU - hemodynamically stable.   Delay start of Pharmacological VTE agent (>24hrs) due to surgical blood loss or risk of bleeding: not applicable

## 2018-08-02 NOTE — Anesthesia Postprocedure Evaluation (Signed)
Anesthesia Post Note  Patient: Caidon Foti  Procedure(s) Performed: PENILE BIOPSY (N/A )     Patient location during evaluation: PACU Anesthesia Type: General Level of consciousness: awake and alert Pain management: pain level controlled Vital Signs Assessment: post-procedure vital signs reviewed and stable Respiratory status: spontaneous breathing, nonlabored ventilation, respiratory function stable and patient connected to nasal cannula oxygen Cardiovascular status: blood pressure returned to baseline and stable Postop Assessment: no apparent nausea or vomiting Anesthetic complications: no    Last Vitals:  Vitals:   08/02/18 1400 08/02/18 1411  BP: 120/77 113/79  Pulse: 75 84  Resp: 15 16  Temp:  36.5 C  SpO2: 100% 96%    Last Pain:  Vitals:   08/02/18 1411  TempSrc:   PainSc: 0-No pain                 Adina Puzzo

## 2018-08-02 NOTE — Discharge Instructions (Signed)
Balanitis    Balanitis is swelling and irritation (inflammation) of the head of the penis (glans penis). The condition may also cause inflammation of the skin around the glans penis (foreskin) in men who have not been circumcised. It may develop because of an infection or another medical condition.  Balanitis occurs most often among men who have not had their foreskin removed (uncircumcised men). Balanitis sometimes causes scarring of the penis or foreskin, which can require surgery. Untreated balanitis can increase the risk of penile cancer.  What are the causes?  Common causes of this condition include:  · Poor personal hygiene, especially in uncircumcised men. Not cleaning the glans penis and foreskin well can result in buildup of bacteria, viruses, and yeast, which can lead to infection and inflammation.  · Irritation and lack of air flow due to fluid (smegma) that can build up on the glans penis.  Other causes include:  · Chemical irritation from products such as soaps or shower gels (especially those that have fragrance), condoms, personal lubricants, petroleum jelly, spermicides, or fabric softeners.  · Skin conditions, such as eczema, dermatitis, and psoriasis.  · Allergies to medicines, such as tetracycline and sulfa drugs.  · Certain medical conditions, including liver cirrhosis, congestive heart failure, diabetes, and kidney disease.  · Infections, such as candidiasis, HPV (human papillomavirus), herpes simplex, gonorrhea, and syphilis.  · Severe obesity.  What increases the risk?  The following factors may make you more likely to develop this condition:  · Having diabetes. This is the most common risk factor.  · Having a tight foreskin that is difficult to pull back (retract) past the glans.  · Having sexual intercourse without using a condom.  What are the signs or symptoms?  Symptoms of this condition include:  · Discharge from under the foreskin.  · A bad smell.  · Pain or difficulty retracting the  foreskin.  · Tenderness, redness, and swelling of the glans.  · A rash or sores on the glans or foreskin.  · Itchiness.  · Inability to get an erection due to pain.  · Difficulty urinating.  · Scarring of the penis or foreskin, in some cases.  How is this diagnosed?  This condition may be diagnosed based on:  · A physical exam.  · Testing a swab of discharge to check for bacterial or fungal infection.  · Blood tests:  ? To check for viruses that can cause balanitis.  ? To check your blood sugar (glucose) level. High blood glucose could be a sign of diabetes, which can cause balanitis.  How is this treated?  Treatment for balanitis depends on the cause. Treatment may include:  · Improving personal hygiene. Your health care provider may recommend sitting in a bath of warm water that is deep enough to cover your hips and buttocks (sitz bath).  · Medicines such as:  ? Creams or ointments to reduce swelling (steroids) or to treat an infection.  ? Antibiotic medicine.  ? Antifungal medicine.  · Surgery to remove or cut the foreskin (circumcision). This may be done if you have scarring on the foreskin that makes it difficult to retract.  · Controlling other medical problems that may be causing your condition or making it worse.  Follow these instructions at home:  · Do not have sex until the condition clears up, or until your health care provider approves.  · Keep your penis clean and dry. Take sitz baths as recommended by your health care provider.  ·   Avoid products that irritate your skin or make symptoms worse, such as soaps and shower gels that have fragrance.  · Take over-the-counter and prescription medicines only as told by your health care provider.  ? If you were prescribed an antibiotic medicine or a cream or ointment, use it as told by your health care provider. Do not stop using your medicine, cream, or ointment even if you start to feel better.  ? Do not drive or use heavy machinery while taking prescription  pain medicine.  Contact a health care provider if:  · Your symptoms get worse or do not improve with home care.  · You develop chills or a fever.  · You have trouble urinating.  · You cannot retract your foreskin.  Get help right away if:  · You develop severe pain.  · You are unable to urinate.  Summary  · Balanitis is inflammation of the head of the penis (glans penis) caused by irritation or infection.  · Balanitis causes pain, redness, and swelling of the glans penis.  · This condition is most common among uncircumcised men who do not keep their glans penis clean and in men who have diabetes.  · Treatment may include creams or ointments.  · Good hygiene is important for prevention. This includes pulling back the foreskin when washing your penis.  This information is not intended to replace advice given to you by your health care provider. Make sure you discuss any questions you have with your health care provider.  Document Released: 06/25/2008 Document Revised: 12/27/2015 Document Reviewed: 12/27/2015  [employer] Interactive Patient Education © 2019 [employer]

## 2018-08-03 ENCOUNTER — Encounter (HOSPITAL_COMMUNITY): Payer: Self-pay | Admitting: Urology

## 2018-08-07 NOTE — Op Note (Signed)
Preoperative diagnosis: glans penile lesion and penile shaft lesion  Postoperative diagnosis: Same  Procedure: 1. Penile punch biopsies 2. Penile block  Attending: Nicolette Bang, MD   Anesthesia: General  History of blood loss: 5cc  Antibiotics: Ancef  Drains: None  Specimens: left glans lesion. Right subcoronal penile shaft lesion  Findings: erythematous lesion involving right glans and subcoronal lesion approximately 1cm in size.  Indications: Patient is a 63 year old male with a history of CIS of the penis that has failed to improve on medical therapy. We discussed the treatment options including observation versus biopsy and the patient elects for biopsy.  Procedure in detail: Prior to procedure consent was obtained.  Patient was brought to the operating room and a brief timeout was done to ensure correct patient, correct procedure, correct site.  MAC anesthesia was administered and patient was placed in supine position.  His genitalia and abdomen was then prepped and draped in usual sterile fashion.  Using a punch biopsy forceps a 102m full thickness biopsy was obtained from the glans and the penile shaft. We then placed 2 interrupted 3-0 chromic sutures and 1 4-0 monocryl interrupted suture to obtain hemostasis. We then injected 0.25% marcaine at the base of the pubic bone to perform the penile block.  A dressing was then applied to the incision.  This then concluded the procedure which was well tolerated by the patient.  Complications: None  Condition: Stable, extubated, transferred to PACU.  Plan: Patient is to be discharged home.  He is to follow up in 1 week for wound check and pathology discussion.

## 2018-08-09 DIAGNOSIS — C609 Malignant neoplasm of penis, unspecified: Secondary | ICD-10-CM | POA: Diagnosis not present

## 2018-09-12 DIAGNOSIS — G894 Chronic pain syndrome: Secondary | ICD-10-CM | POA: Diagnosis not present

## 2018-09-12 DIAGNOSIS — N189 Chronic kidney disease, unspecified: Secondary | ICD-10-CM | POA: Diagnosis not present

## 2018-09-12 DIAGNOSIS — E1122 Type 2 diabetes mellitus with diabetic chronic kidney disease: Secondary | ICD-10-CM | POA: Diagnosis not present

## 2018-09-12 DIAGNOSIS — I10 Essential (primary) hypertension: Secondary | ICD-10-CM | POA: Diagnosis not present

## 2018-09-30 DIAGNOSIS — Z8619 Personal history of other infectious and parasitic diseases: Secondary | ICD-10-CM | POA: Diagnosis not present

## 2018-09-30 DIAGNOSIS — I10 Essential (primary) hypertension: Secondary | ICD-10-CM | POA: Diagnosis not present

## 2018-10-14 DIAGNOSIS — C609 Malignant neoplasm of penis, unspecified: Secondary | ICD-10-CM | POA: Diagnosis not present

## 2018-10-18 DIAGNOSIS — H43811 Vitreous degeneration, right eye: Secondary | ICD-10-CM | POA: Diagnosis not present

## 2018-10-18 DIAGNOSIS — E119 Type 2 diabetes mellitus without complications: Secondary | ICD-10-CM | POA: Diagnosis not present

## 2019-01-13 DIAGNOSIS — C609 Malignant neoplasm of penis, unspecified: Secondary | ICD-10-CM | POA: Diagnosis not present

## 2019-03-11 DIAGNOSIS — B079 Viral wart, unspecified: Secondary | ICD-10-CM | POA: Diagnosis not present

## 2019-03-11 DIAGNOSIS — D485 Neoplasm of uncertain behavior of skin: Secondary | ICD-10-CM | POA: Diagnosis not present

## 2019-03-11 DIAGNOSIS — L810 Postinflammatory hyperpigmentation: Secondary | ICD-10-CM | POA: Diagnosis not present

## 2019-03-11 DIAGNOSIS — L720 Epidermal cyst: Secondary | ICD-10-CM | POA: Diagnosis not present

## 2019-03-11 DIAGNOSIS — L723 Sebaceous cyst: Secondary | ICD-10-CM | POA: Diagnosis not present

## 2019-04-15 DIAGNOSIS — L219 Seborrheic dermatitis, unspecified: Secondary | ICD-10-CM | POA: Diagnosis not present

## 2019-04-15 DIAGNOSIS — L0291 Cutaneous abscess, unspecified: Secondary | ICD-10-CM | POA: Diagnosis not present

## 2019-04-15 DIAGNOSIS — L739 Follicular disorder, unspecified: Secondary | ICD-10-CM | POA: Diagnosis not present

## 2019-05-02 DIAGNOSIS — L723 Sebaceous cyst: Secondary | ICD-10-CM | POA: Diagnosis not present

## 2019-05-02 DIAGNOSIS — L578 Other skin changes due to chronic exposure to nonionizing radiation: Secondary | ICD-10-CM | POA: Diagnosis not present

## 2019-05-02 DIAGNOSIS — L821 Other seborrheic keratosis: Secondary | ICD-10-CM | POA: Diagnosis not present

## 2019-05-02 DIAGNOSIS — L814 Other melanin hyperpigmentation: Secondary | ICD-10-CM | POA: Diagnosis not present

## 2019-05-02 DIAGNOSIS — D1801 Hemangioma of skin and subcutaneous tissue: Secondary | ICD-10-CM | POA: Diagnosis not present

## 2019-06-13 DIAGNOSIS — H43812 Vitreous degeneration, left eye: Secondary | ICD-10-CM | POA: Diagnosis not present

## 2019-10-03 DIAGNOSIS — Z23 Encounter for immunization: Secondary | ICD-10-CM | POA: Diagnosis not present

## 2019-10-03 DIAGNOSIS — N183 Chronic kidney disease, stage 3 unspecified: Secondary | ICD-10-CM | POA: Diagnosis not present

## 2019-10-03 DIAGNOSIS — I10 Essential (primary) hypertension: Secondary | ICD-10-CM | POA: Diagnosis not present

## 2019-10-03 DIAGNOSIS — E1122 Type 2 diabetes mellitus with diabetic chronic kidney disease: Secondary | ICD-10-CM | POA: Diagnosis not present

## 2019-10-03 DIAGNOSIS — E782 Mixed hyperlipidemia: Secondary | ICD-10-CM | POA: Diagnosis not present

## 2019-10-10 DIAGNOSIS — Z8619 Personal history of other infectious and parasitic diseases: Secondary | ICD-10-CM | POA: Diagnosis not present

## 2019-10-10 DIAGNOSIS — I10 Essential (primary) hypertension: Secondary | ICD-10-CM | POA: Diagnosis not present

## 2019-10-10 DIAGNOSIS — E1122 Type 2 diabetes mellitus with diabetic chronic kidney disease: Secondary | ICD-10-CM | POA: Diagnosis not present

## 2019-10-10 DIAGNOSIS — Z125 Encounter for screening for malignant neoplasm of prostate: Secondary | ICD-10-CM | POA: Diagnosis not present

## 2019-11-28 DIAGNOSIS — R152 Fecal urgency: Secondary | ICD-10-CM | POA: Diagnosis not present

## 2019-11-28 DIAGNOSIS — L03011 Cellulitis of right finger: Secondary | ICD-10-CM | POA: Diagnosis not present

## 2019-11-28 DIAGNOSIS — Z23 Encounter for immunization: Secondary | ICD-10-CM | POA: Diagnosis not present

## 2019-12-11 DIAGNOSIS — Z961 Presence of intraocular lens: Secondary | ICD-10-CM | POA: Diagnosis not present

## 2019-12-11 DIAGNOSIS — H43812 Vitreous degeneration, left eye: Secondary | ICD-10-CM | POA: Diagnosis not present

## 2019-12-11 DIAGNOSIS — H0102A Squamous blepharitis right eye, upper and lower eyelids: Secondary | ICD-10-CM | POA: Diagnosis not present

## 2019-12-11 DIAGNOSIS — E119 Type 2 diabetes mellitus without complications: Secondary | ICD-10-CM | POA: Diagnosis not present

## 2019-12-26 DIAGNOSIS — Z23 Encounter for immunization: Secondary | ICD-10-CM | POA: Diagnosis not present

## 2020-02-06 DIAGNOSIS — R159 Full incontinence of feces: Secondary | ICD-10-CM | POA: Diagnosis not present

## 2020-02-06 DIAGNOSIS — R32 Unspecified urinary incontinence: Secondary | ICD-10-CM | POA: Diagnosis not present

## 2020-03-09 DIAGNOSIS — M545 Low back pain, unspecified: Secondary | ICD-10-CM | POA: Diagnosis not present

## 2020-04-16 DIAGNOSIS — M549 Dorsalgia, unspecified: Secondary | ICD-10-CM | POA: Diagnosis not present

## 2020-04-16 DIAGNOSIS — R3915 Urgency of urination: Secondary | ICD-10-CM | POA: Diagnosis not present

## 2020-04-16 DIAGNOSIS — E1169 Type 2 diabetes mellitus with other specified complication: Secondary | ICD-10-CM | POA: Diagnosis not present

## 2020-04-16 DIAGNOSIS — E162 Hypoglycemia, unspecified: Secondary | ICD-10-CM | POA: Diagnosis not present

## 2020-04-29 DIAGNOSIS — Z01812 Encounter for preprocedural laboratory examination: Secondary | ICD-10-CM | POA: Diagnosis not present

## 2020-05-04 DIAGNOSIS — D124 Benign neoplasm of descending colon: Secondary | ICD-10-CM | POA: Diagnosis not present

## 2020-05-04 DIAGNOSIS — R159 Full incontinence of feces: Secondary | ICD-10-CM | POA: Diagnosis not present

## 2020-05-04 DIAGNOSIS — D123 Benign neoplasm of transverse colon: Secondary | ICD-10-CM | POA: Diagnosis not present

## 2020-05-04 DIAGNOSIS — K5730 Diverticulosis of large intestine without perforation or abscess without bleeding: Secondary | ICD-10-CM | POA: Diagnosis not present

## 2020-05-04 DIAGNOSIS — R194 Change in bowel habit: Secondary | ICD-10-CM | POA: Diagnosis not present

## 2020-06-25 DIAGNOSIS — L821 Other seborrheic keratosis: Secondary | ICD-10-CM | POA: Diagnosis not present

## 2020-06-25 DIAGNOSIS — D485 Neoplasm of uncertain behavior of skin: Secondary | ICD-10-CM | POA: Diagnosis not present

## 2020-06-25 DIAGNOSIS — L918 Other hypertrophic disorders of the skin: Secondary | ICD-10-CM | POA: Diagnosis not present

## 2020-06-25 DIAGNOSIS — L570 Actinic keratosis: Secondary | ICD-10-CM | POA: Diagnosis not present

## 2020-06-25 DIAGNOSIS — L578 Other skin changes due to chronic exposure to nonionizing radiation: Secondary | ICD-10-CM | POA: Diagnosis not present

## 2020-06-25 DIAGNOSIS — L723 Sebaceous cyst: Secondary | ICD-10-CM | POA: Diagnosis not present

## 2020-06-25 DIAGNOSIS — L814 Other melanin hyperpigmentation: Secondary | ICD-10-CM | POA: Diagnosis not present

## 2020-06-25 DIAGNOSIS — D1801 Hemangioma of skin and subcutaneous tissue: Secondary | ICD-10-CM | POA: Diagnosis not present

## 2020-07-16 DIAGNOSIS — D171 Benign lipomatous neoplasm of skin and subcutaneous tissue of trunk: Secondary | ICD-10-CM | POA: Diagnosis not present

## 2020-07-16 DIAGNOSIS — L723 Sebaceous cyst: Secondary | ICD-10-CM | POA: Diagnosis not present

## 2020-10-15 DIAGNOSIS — Z0000 Encounter for general adult medical examination without abnormal findings: Secondary | ICD-10-CM | POA: Diagnosis not present

## 2020-10-15 DIAGNOSIS — E782 Mixed hyperlipidemia: Secondary | ICD-10-CM | POA: Diagnosis not present

## 2020-10-15 DIAGNOSIS — N183 Chronic kidney disease, stage 3 unspecified: Secondary | ICD-10-CM | POA: Diagnosis not present

## 2020-10-15 DIAGNOSIS — E1122 Type 2 diabetes mellitus with diabetic chronic kidney disease: Secondary | ICD-10-CM | POA: Diagnosis not present

## 2020-10-15 DIAGNOSIS — Z23 Encounter for immunization: Secondary | ICD-10-CM | POA: Diagnosis not present

## 2020-10-15 DIAGNOSIS — Z125 Encounter for screening for malignant neoplasm of prostate: Secondary | ICD-10-CM | POA: Diagnosis not present

## 2020-10-15 DIAGNOSIS — I10 Essential (primary) hypertension: Secondary | ICD-10-CM | POA: Diagnosis not present

## 2020-10-22 ENCOUNTER — Ambulatory Visit: Payer: Self-pay | Admitting: Surgery

## 2020-10-22 DIAGNOSIS — L720 Epidermal cyst: Secondary | ICD-10-CM | POA: Diagnosis not present

## 2020-10-22 DIAGNOSIS — D171 Benign lipomatous neoplasm of skin and subcutaneous tissue of trunk: Secondary | ICD-10-CM | POA: Diagnosis not present

## 2020-10-22 NOTE — H&P (Signed)
History of Present Illness: Tommy Rojas is a 65 y.o. male who was referred to me for evaluation of a lipoma and a sebaceous cyst.  He first noticed a lump on his left upper back several months ago.  It has not had any drainage and is not painful but is uncomfortable and bothers him.  He has also had an area of drainage on the left gluteus for several months.  He has never had a lesion like this before.  He has no personal history of inflammatory bowel disease.  He reports that he had a colonoscopy a month ago that was normal (we do not have the report from this).  He was evaluated by a dermatologist and referred to me for excision of both of these lesions.       Review of Systems: A complete review of systems was obtained from the patient.  I have reviewed this information and discussed as appropriate with the patient.  See HPI as well for other ROS.      Medical History: Past Medical History Past Medical History: Diagnosis Date  Anxiety, generalized    BPH with urinary obstruction    DM2 (diabetes mellitus, type 2) (CMS-HCC)    GERD (gastroesophageal reflux disease)    Glaucoma    Hepatitis C    HTN (hypertension)    Hypercholesteremia    Penile cancer (CMS-HCC) 03/08/2017      Patient Active Problem List Diagnosis  Penile cancer (CMS-HCC)  Hypertension, essential  GERD (gastroesophageal reflux disease)  DM2 (diabetes mellitus, type 2) (CMS-HCC)  Anxiety, generalized  Glaucoma  Hypercholesteremia  Stricture of bulbous urethra in male     Past Surgical History Past Surgical History: Procedure Laterality Date  CEREBRAL ANEURYSM REPAIR      CHOLECYSTECTOMY      MEATOTOMY       65 y/o for split stream  NECK SURGERY      SHOULDER SURGERY      WRIST SURGERY          Allergies No Known Allergies    Current Outpatient Medications on File Prior to Visit Medication Sig Dispense Refill  ketoconazole (NIZORAL) 2 % shampoo 5 ml      aspirin 81 MG EC tablet Take 81 mg by  mouth once daily      DULoxetine (CYMBALTA) 60 MG DR capsule Take 60 mg by mouth once daily      glimepiride (AMARYL) 4 MG tablet Take 4 mg by mouth daily with breakfast      HYDROcodone-acetaminophen (NORCO) 7.5-325 mg tablet 1 TABLET ORALLY TWICE A DAY AS NEEDED FOR PAIN 30 DAYS      imiquimod (ALDARA) 5 % cream Apply 1 packet topically 3 (three) times a week Take as directed 24 packet 1  lisinopril (PRINIVIL,ZESTRIL) 10 MG tablet Take 10 mg by mouth once daily      metFORMIN (GLUCOPHAGE) 500 MG tablet TAKE 1 TABLET BY MOUTH TWICE A DAY WITH A MEAL      minocycline (MINOCIN,DYNACIN) 100 MG capsule Take 100 mg by mouth every 12 (twelve) hours      naproxen (NAPROSYN) 500 MG tablet Take 500 mg by mouth 2 (two) times daily      pravastatin (PRAVACHOL) 10 MG tablet Take 10 mg by mouth nightly      tamsulosin (FLOMAX) 0.4 mg capsule Take 0.4 mg by mouth once daily Take 30 minutes after same meal each day.       No current facility-administered medications on file  prior to visit.     Family History No family history on file.     Social History   Tobacco Use Smoking Status Former Smoker  Packs/day: 1.00  Years: 40.00  Pack years: 40.00  Types: Cigarettes  Quit date: 03/14/2009  Years since quitting: 11.6 Smokeless Tobacco Never Used Tobacco Comment   still smokes a cigar 2-3 times per year      Social History Social History    Socioeconomic History  Marital status: Married Tobacco Use  Smoking status: Former Smoker     Packs/day: 1.00     Years: 40.00     Pack years: 40.00     Types: Cigarettes     Quit date: 03/14/2009     Years since quitting: 11.6  Smokeless tobacco: Never Used  Tobacco comment: still smokes a cigar 2-3 times per year  Vaping Use  Vaping Use: Never used Substance and Sexual Activity  Alcohol use: Yes     Alcohol/week: 6.0 standard drinks     Types: 6 Cans of beer per week      Objective:     Vitals:   10/22/20  1356 BP: 120/78 Pulse: 94 Temp: 36.8 C (98.3 F) SpO2: 99% Weight: 81.6 kg (180 lb) Height: 177.8 cm ('5\' 10"'$ )   Body mass index is 25.83 kg/m.   Physical Exam Vitals reviewed.  Constitutional:      General: He is not in acute distress.    Appearance: Normal appearance.  HENT:     Head: Normocephalic and atraumatic.  Eyes:     General: No scleral icterus.    Conjunctiva/sclera: Conjunctivae normal.  Cardiovascular:     Rate and Rhythm: Normal rate and regular rhythm.  Pulmonary:     Effort: Pulmonary effort is normal. No respiratory distress.     Breath sounds: Normal breath sounds.  Genitourinary:    Comments: Small area of induration approximately 1 cm on the left medial inferior gluteus, with a small amount of purulent drainage. Musculoskeletal:        General: Normal range of motion.     Cervical back: Normal range of motion.  Skin:    General: Skin is warm and dry.     Comments: Soft well-circumscribed subcutaneous mass on the left upper back on the superior border of the scapula.  No overlying skin changes.  Neurological:     General: No focal deficit present.     Mental Status: He is alert and oriented to person, place, and time.  Psychiatric:        Mood and Affect: Mood normal.        Behavior: Behavior normal.        Thought Content: Thought content normal.            Assessment and Plan: Diagnoses and all orders for this visit:   Lipoma of torso   Inclusion cyst       This is a 65 year old male referred with a lipoma of the left upper back, and what appears to be an inclusion cyst on the left gluteus.  However there is a small possibility that the gluteal lesion is a perianal fistula.  We will proceed with lipoma excision as well as excision of the sebaceous cyst with rectal exam under anesthesia to rule out a fistula.  Procedure details were discussed with the patient and he agrees to proceed.  He will be contacted to schedule an elective surgery  date.  Michaelle Birks, MD [facility] Surgery General,  Hepatobiliary and Pancreatic Surgery 10/22/20 2:18 PM

## 2020-11-25 NOTE — Pre-Procedure Instructions (Signed)
Surgical Instructions   Your procedure is scheduled on Friday, October 14th. Report to [facility] Main Entrance "A" at 2:00 P.M., then check in with the Admitting office. Call this number if you have problems the morning of surgery: [phone]   If you have any questions prior to your surgery date call [phone]: Open Monday-Friday 8am-4pm   Remember: Do not eat or drink after midnight the night before your surgery     Take these medicines the morning of surgery with A SIP OF WATER  DULoxetine (CYMBALTA) pravastatin (PRAVACHOL) tamsulosin (FLOMAX)   If needed: cyclobenzaprine (FLEXERIL) HYDROcodone-acetaminophen (NORCO)   As of today, STOP taking any Aspirin (unless otherwise instructed by your surgeon) Aleve, Naproxen, Ibuprofen, Motrin, Advil, Goody's, BC's, all herbal medications, fish oil, and all vitamins.    WHAT DO I DO ABOUT MY DIABETES MEDICATION?   Do not take empagliflozin (JARDIANCE) the day before surgery (10/13) or the morning of surgery (10/14).     HOW TO MANAGE YOUR DIABETES BEFORE AND AFTER SURGERY  Why is it important to control my blood sugar before and after surgery? Improving blood sugar levels before and after surgery helps healing and can limit problems. A way of improving blood sugar control is eating a healthy diet by:  Eating less sugar and carbohydrates  Increasing activity/exercise  Talking with your doctor about reaching your blood sugar goals High blood sugars (greater than 180 mg/dL) can raise your risk of infections and slow your recovery, so you will need to focus on controlling your diabetes during the weeks before surgery. Make sure that the doctor who takes care of your diabetes knows about your planned surgery including the date and location.  How do I manage my blood sugar before surgery? Check your blood sugar at least 4 times a day, starting 2 days before surgery, to make sure that the level is not too high or  low.  Check your blood sugar the morning of your surgery when you wake up and every 2 hours until you get to the Short Stay unit.  If your blood sugar is less than 70 mg/dL, you will need to treat for low blood sugar: Do not take insulin. Treat a low blood sugar (less than 70 mg/dL) with  cup of clear juice (cranberry or apple), 4 glucose tablets, OR glucose gel. Recheck blood sugar in 15 minutes after treatment (to make sure it is greater than 70 mg/dL). If your blood sugar is not greater than 70 mg/dL on recheck, call [phone] for further instructions. Report your blood sugar to the short stay nurse when you get to Short Stay.  If you are admitted to the hospital after surgery: Your blood sugar will be checked by the staff and you will probably be given insulin after surgery (instead of oral diabetes medicines) to make sure you have good blood sugar levels. The goal for blood sugar control after surgery is 80-180 mg/dL.                   Do NOT Smoke (Tobacco/Vaping) or drink Alcohol 24 hours prior to your procedure.  If you use a CPAP at night, you may bring all equipment for your overnight stay.   Contacts, glasses, piercing's, hearing aid's, dentures or partials may not be worn into surgery, please bring cases for these belongings.    For patients admitted to the hospital, discharge time will be determined by your treatment team.   Patients discharged the day of surgery will  not be allowed to drive home, and someone needs to stay with them for 24 hours.  NO VISITORS WILL BE ALLOWED IN PRE-OP WHERE PATIENTS GET READY FOR SURGERY.  ONLY 1 SUPPORT PERSON MAY BE PRESENT IN THE WAITING ROOM WHILE YOU ARE IN SURGERY.  IF YOU ARE TO BE ADMITTED, ONCE YOU ARE IN YOUR ROOM YOU WILL BE ALLOWED TWO (2) VISITORS.  Minor children may have two parents present. Special consideration for safety and communication needs will be reviewed on a case by case basis.   Special instructions:   [facility]- Preparing For Surgery  Before surgery, you can play an important role. Because skin is not sterile, your skin needs to be as free of germs as possible. You can reduce the number of germs on your skin by washing with CHG (chlorahexidine gluconate) Soap before surgery.  CHG is an antiseptic cleaner which kills germs and bonds with the skin to continue killing germs even after washing.    Oral Hygiene is also important to reduce your risk of infection.  Remember - BRUSH YOUR TEETH THE MORNING OF SURGERY WITH YOUR REGULAR TOOTHPASTE  Please do not use if you have an allergy to CHG or antibacterial soaps. If your skin becomes reddened/irritated stop using the CHG.  Do not shave (including legs and underarms) for at least 48 hours prior to first CHG shower. It is OK to shave your face.  Please follow these instructions carefully.   Shower the NIGHT BEFORE SURGERY and the MORNING OF SURGERY  If you chose to wash your hair, wash your hair first as usual with your normal shampoo.  After you shampoo, rinse your hair and body thoroughly to remove the shampoo.  Use CHG Soap as you would any other liquid soap. You can apply CHG directly to the skin and wash gently with a scrungie or a clean washcloth.   Apply the CHG Soap to your body ONLY FROM THE NECK DOWN.  Do not use on open wounds or open sores. Avoid contact with your eyes, ears, mouth and genitals (private parts). Wash Face and genitals (private parts)  with your normal soap.   Wash thoroughly, paying special attention to the area where your surgery will be performed.  Thoroughly rinse your body with warm water from the neck down.  DO NOT shower/wash with your normal soap after using and rinsing off the CHG Soap.  Pat yourself dry with a CLEAN TOWEL.  Wear CLEAN PAJAMAS to bed the night before surgery  Place CLEAN SHEETS on your bed the night before your surgery  DO NOT SLEEP WITH PETS.   Day of Surgery: Shower with CHG  soap. Do not wear jewelry Do not wear lotions, powders, colognes, or deodorant. Men may shave face and neck. Do not bring valuables to the hospital. [facility] is not responsible for any belongings or valuables. Wear Clean/Comfortable clothing the morning of surgery Remember to brush your teeth WITH YOUR REGULAR TOOTHPASTE.   Please read over the following fact sheets that you were given.   3 days prior to your procedure or After your COVID test   You are not required to quarantine however you are required to wear a well-fitting mask when you are out and around people not in your household. If your mask becomes wet or soiled, replace with a new one.   Wash your hands often with soap and water for 20 seconds or clean your hands with an alcohol-based hand sanitizer that  contains at least 60% alcohol.   Do not share personal items.   Notify your provider:  o if you are in close contact with someone who has COVID  o or if you develop a fever of 100.4 or greater, sneezing, cough, sore throat, shortness of breath or body aches.

## 2020-11-26 ENCOUNTER — Other Ambulatory Visit: Payer: Self-pay

## 2020-11-26 ENCOUNTER — Encounter (HOSPITAL_COMMUNITY)
Admission: RE | Admit: 2020-11-26 | Discharge: 2020-11-26 | Disposition: A | Discharge status: Home / Self Care | Payer: BC Managed Care – PPO | Source: Ambulatory Visit | Attending: Surgery | Admitting: Surgery

## 2020-11-26 ENCOUNTER — Encounter (HOSPITAL_COMMUNITY): Payer: Self-pay

## 2020-11-26 DIAGNOSIS — Z01812 Encounter for preprocedural laboratory examination: Secondary | ICD-10-CM | POA: Diagnosis not present

## 2020-11-26 LAB — CBC
HCT: 53 % — ABNORMAL HIGH (ref 39.0–52.0)
Hemoglobin: 17.6 g/dL — ABNORMAL HIGH (ref 13.0–17.0)
MCH: 31.2 pg (ref 26.0–34.0)
MCHC: 33.2 g/dL (ref 30.0–36.0)
MCV: 93.8 fL (ref 80.0–100.0)
Platelets: 188 10*3/uL (ref 150–400)
RBC: 5.65 MIL/uL (ref 4.22–5.81)
RDW: 12.5 % (ref 11.5–15.5)
WBC: 7.2 10*3/uL (ref 4.0–10.5)
nRBC: 0 % (ref 0.0–0.2)

## 2020-11-26 LAB — COMPREHENSIVE METABOLIC PANEL
ALT: 19 U/L (ref 0–44)
AST: 17 U/L (ref 15–41)
Albumin: 4.1 g/dL (ref 3.5–5.0)
Alkaline Phosphatase: 98 U/L (ref 38–126)
Anion gap: 7 (ref 5–15)
BUN: 22 mg/dL (ref 8–23)
CO2: 24 mmol/L (ref 22–32)
Calcium: 9.8 mg/dL (ref 8.9–10.3)
Chloride: 107 mmol/L (ref 98–111)
Creatinine, Ser: 1.19 mg/dL (ref 0.61–1.24)
GFR, Estimated: >60 mL/min (ref >60)
Glucose, Bld: 156 mg/dL — ABNORMAL HIGH (ref 70–99)
Potassium: 4.8 mmol/L (ref 3.5–5.1)
Sodium: 138 mmol/L (ref 135–145)
Total Bilirubin: 0.5 mg/dL (ref 0.3–1.2)
Total Protein: 7.3 g/dL (ref 6.5–8.1)

## 2020-11-26 LAB — HEMOGLOBIN A1C
Hgb A1c MFr Bld: 6.7 % — ABNORMAL HIGH (ref 4.8–5.6)
Mean Plasma Glucose: 145.59 mg/dL

## 2020-11-26 LAB — GLUCOSE, CAPILLARY: Glucose-Capillary: 265 mg/dL — ABNORMAL HIGH (ref 70–99)

## 2020-11-26 NOTE — Progress Notes (Signed)
Per Merleen Nicely in Dr. Ayesha Rumpf office, pt's surgery has been moved up to 12pm . Dr. Zenia Resides is going to keep pt NPO after midnight and has made patient aware of new surgery time and diet status.

## 2020-11-26 NOTE — Progress Notes (Signed)
PCP - C.Melinda Crutch MD Cardiologist - none  PPM/ICD - denies Device Orders -  Rep Notified -   Chest x-ray - none EKG - DOS Stress Test -2012  ECHO - none Cardiac Cath - none  Sleep Study - none CPAP -   Fasting Blood Sugar -  Checks Blood Sugar -pt states he doesn't check his blood sugar. He does have CBG meter and I instructed him to check his blood sugar prior to surgery as his surgery could be delayed or canceled if his blood sugar is too high.   Blood Thinner Instructions:n/a Aspirin Instructions:pt instructed to call Dr. Ayesha Rumpf office regarding whether or not he should stop aspirin  ERAS Protcol -no. Dr. Zenia Resides ordered for the pt to be NPO after midnight. Pt's surgery is at 4pm. He stated that he could not go that long without eating because of his diabetes. I instructed pt to call Dr. Ayesha Rumpf office and ask about changing diet order. I spoke with Merleen Nicely in Dr. Ayesha Rumpf office to notify Dr. Zenia Resides of pt's concerns and see if pt could possibly have ERAS order. Merleen Nicely will notify Dr. Zenia Resides and she will contact pt if changes are made.  PRE-SURGERY Ensure or G2-   COVID TEST- n/a ambulatory surgery.   Anesthesia review: no  Patient denies shortness of breath, fever, cough and chest pain at PAT appointment   All instructions explained to the patient, with a verbal understanding of the material. Patient agrees to go over the instructions while at home for a better understanding. Patient also instructed to self quarantine after being tested for COVID-19. The opportunity to ask questions was provided.

## 2020-12-03 ENCOUNTER — Encounter (HOSPITAL_COMMUNITY)
Admission: RE | Disposition: A | Discharge status: Home Health | Payer: Self-pay | Source: Home / Self Care | Attending: Surgery

## 2020-12-03 ENCOUNTER — Ambulatory Visit (HOSPITAL_COMMUNITY)
Admission: RE | Admit: 2020-12-03 | Discharge: 2020-12-03 | Disposition: A | Discharge status: Home Health | Payer: BC Managed Care – PPO | Attending: Surgery | Admitting: Surgery

## 2020-12-03 ENCOUNTER — Other Ambulatory Visit: Payer: Self-pay

## 2020-12-03 ENCOUNTER — Ambulatory Visit (HOSPITAL_COMMUNITY): Payer: BC Managed Care – PPO | Admitting: Anesthesiology

## 2020-12-03 ENCOUNTER — Ambulatory Visit (HOSPITAL_COMMUNITY): Payer: BC Managed Care – PPO | Admitting: Emergency Medicine

## 2020-12-03 ENCOUNTER — Encounter (HOSPITAL_COMMUNITY): Payer: Self-pay | Admitting: Surgery

## 2020-12-03 DIAGNOSIS — Z79899 Other long term (current) drug therapy: Secondary | ICD-10-CM | POA: Diagnosis not present

## 2020-12-03 DIAGNOSIS — Z7982 Long term (current) use of aspirin: Secondary | ICD-10-CM | POA: Insufficient documentation

## 2020-12-03 DIAGNOSIS — F17200 Nicotine dependence, unspecified, uncomplicated: Secondary | ICD-10-CM | POA: Insufficient documentation

## 2020-12-03 DIAGNOSIS — M7989 Other specified soft tissue disorders: Secondary | ICD-10-CM | POA: Diagnosis not present

## 2020-12-03 DIAGNOSIS — Z9049 Acquired absence of other specified parts of digestive tract: Secondary | ICD-10-CM | POA: Diagnosis not present

## 2020-12-03 DIAGNOSIS — I10 Essential (primary) hypertension: Secondary | ICD-10-CM | POA: Diagnosis not present

## 2020-12-03 DIAGNOSIS — K219 Gastro-esophageal reflux disease without esophagitis: Secondary | ICD-10-CM | POA: Diagnosis not present

## 2020-12-03 DIAGNOSIS — D171 Benign lipomatous neoplasm of skin and subcutaneous tissue of trunk: Secondary | ICD-10-CM | POA: Diagnosis not present

## 2020-12-03 DIAGNOSIS — L988 Other specified disorders of the skin and subcutaneous tissue: Secondary | ICD-10-CM | POA: Diagnosis not present

## 2020-12-03 DIAGNOSIS — E119 Type 2 diabetes mellitus without complications: Secondary | ICD-10-CM | POA: Diagnosis not present

## 2020-12-03 DIAGNOSIS — L928 Other granulomatous disorders of the skin and subcutaneous tissue: Secondary | ICD-10-CM | POA: Diagnosis not present

## 2020-12-03 HISTORY — PX: RECTAL EXAM UNDER ANESTHESIA: SHX6399

## 2020-12-03 HISTORY — PX: CYST EXCISION: SHX5701

## 2020-12-03 HISTORY — PX: LIPOMA EXCISION: SHX5283

## 2020-12-03 LAB — GLUCOSE, CAPILLARY
Glucose-Capillary: 161 mg/dL — ABNORMAL HIGH (ref 70–99)
Glucose-Capillary: 149 mg/dL — ABNORMAL HIGH (ref 70–99)

## 2020-12-03 SURGERY — EXCISION LIPOMA
Anesthesia: General

## 2020-12-03 MED ORDER — ROCURONIUM BROMIDE 10 MG/ML (PF) SYRINGE
PREFILLED_SYRINGE | INTRAVENOUS | Status: AC
  Filled 2020-12-03: qty 10

## 2020-12-03 MED ORDER — CEFAZOLIN SODIUM-DEXTROSE 2-4 GM/100ML-% IV SOLN
2.0000 g | INTRAVENOUS | Status: AC
  Administered 2020-12-03: 2 g via INTRAVENOUS
  Filled 2020-12-03: qty 100

## 2020-12-03 MED ORDER — SUGAMMADEX SODIUM 200 MG/2ML IV SOLN
INTRAVENOUS | Status: DC | PRN
  Administered 2020-12-03: 200 mg via INTRAVENOUS

## 2020-12-03 MED ORDER — BUPIVACAINE-EPINEPHRINE (PF) 0.25% -1:200000 IJ SOLN
INTRAMUSCULAR | Status: AC
  Filled 2020-12-03: qty 30

## 2020-12-03 MED ORDER — DEXAMETHASONE SODIUM PHOSPHATE 10 MG/ML IJ SOLN
INTRAMUSCULAR | Status: DC | PRN
  Administered 2020-12-03: 10 mg via INTRAVENOUS

## 2020-12-03 MED ORDER — HYDROCODONE-ACETAMINOPHEN 7.5-325 MG PO TABS: 1.0000 | ORAL_TABLET | Freq: Four times a day (QID) | ORAL | 0 refills | Status: AC | PRN

## 2020-12-03 MED ORDER — LIDOCAINE 2% (20 MG/ML) 5 ML SYRINGE
INTRAMUSCULAR | Status: AC
  Filled 2020-12-03: qty 5

## 2020-12-03 MED ORDER — ONDANSETRON HCL 4 MG/2ML IJ SOLN
INTRAMUSCULAR | Status: DC | PRN
  Administered 2020-12-03: 4 mg via INTRAVENOUS

## 2020-12-03 MED ORDER — SODIUM CHLORIDE 0.9 % IV SOLN
INTRAVENOUS | Status: DC | PRN
  Administered 2020-12-03: 20 mL

## 2020-12-03 MED ORDER — 0.9 % SODIUM CHLORIDE (POUR BTL) OPTIME
TOPICAL | Status: DC | PRN
  Administered 2020-12-03: 1000 mL

## 2020-12-03 MED ORDER — PROPOFOL 10 MG/ML IV BOLUS
INTRAVENOUS | Status: AC
  Filled 2020-12-03: qty 20

## 2020-12-03 MED ORDER — CHLORHEXIDINE GLUCONATE CLOTH 2 % EX PADS: 6.0000 | MEDICATED_PAD | Freq: Once | CUTANEOUS | Status: DC

## 2020-12-03 MED ORDER — FENTANYL CITRATE (PF) 100 MCG/2ML IJ SOLN
INTRAMUSCULAR | Status: DC | PRN
  Administered 2020-12-03: 50 ug via INTRAVENOUS
  Administered 2020-12-03: 100 ug via INTRAVENOUS
  Administered 2020-12-03: 50 ug via INTRAVENOUS

## 2020-12-03 MED ORDER — ORAL CARE MOUTH RINSE: 15.0000 mL | Freq: Once | OROMUCOSAL | Status: AC

## 2020-12-03 MED ORDER — CHLORHEXIDINE GLUCONATE 0.12 % MT SOLN
15.0000 mL | Freq: Once | OROMUCOSAL | Status: AC
  Administered 2020-12-03: 15 mL via OROMUCOSAL
  Filled 2020-12-03: qty 15

## 2020-12-03 MED ORDER — FENTANYL CITRATE (PF) 250 MCG/5ML IJ SOLN
INTRAMUSCULAR | Status: AC
  Filled 2020-12-03: qty 5

## 2020-12-03 MED ORDER — MIDAZOLAM HCL 2 MG/2ML IJ SOLN
INTRAMUSCULAR | Status: AC
  Filled 2020-12-03: qty 2

## 2020-12-03 MED ORDER — ROCURONIUM BROMIDE 10 MG/ML (PF) SYRINGE
PREFILLED_SYRINGE | INTRAVENOUS | Status: DC | PRN
  Administered 2020-12-03: 60 mg via INTRAVENOUS

## 2020-12-03 MED ORDER — LACTATED RINGERS IV SOLN: INTRAVENOUS | Status: DC

## 2020-12-03 MED ORDER — DEXAMETHASONE SODIUM PHOSPHATE 10 MG/ML IJ SOLN
INTRAMUSCULAR | Status: AC
  Filled 2020-12-03: qty 1

## 2020-12-03 MED ORDER — LIDOCAINE 2% (20 MG/ML) 5 ML SYRINGE
INTRAMUSCULAR | Status: DC | PRN
  Administered 2020-12-03: 100 mg via INTRAVENOUS

## 2020-12-03 MED ORDER — BUPIVACAINE LIPOSOME 1.3 % IJ SUSP
INTRAMUSCULAR | Status: AC
  Filled 2020-12-03: qty 20

## 2020-12-03 MED ORDER — PHENYLEPHRINE HCL (PRESSORS) 10 MG/ML IV SOLN
INTRAVENOUS | Status: DC | PRN
  Administered 2020-12-03: 120 ug via INTRAVENOUS
  Administered 2020-12-03: 40 ug via INTRAVENOUS

## 2020-12-03 MED ORDER — THROMBIN 20000 UNITS EX KIT
PACK | CUTANEOUS | Status: AC
  Filled 2020-12-03: qty 1

## 2020-12-03 MED ORDER — ONDANSETRON HCL 4 MG/2ML IJ SOLN
INTRAMUSCULAR | Status: AC
  Filled 2020-12-03: qty 2

## 2020-12-03 MED ORDER — PROPOFOL 10 MG/ML IV BOLUS
INTRAVENOUS | Status: DC | PRN
  Administered 2020-12-03: 200 mg via INTRAVENOUS

## 2020-12-03 MED ORDER — PHENYLEPHRINE 40 MCG/ML (10ML) SYRINGE FOR IV PUSH (FOR BLOOD PRESSURE SUPPORT)
PREFILLED_SYRINGE | INTRAVENOUS | Status: AC
  Filled 2020-12-03: qty 10

## 2020-12-03 MED ORDER — MIDAZOLAM HCL 5 MG/5ML IJ SOLN
INTRAMUSCULAR | Status: DC | PRN
  Administered 2020-12-03: 2 mg via INTRAVENOUS

## 2020-12-03 SURGICAL SUPPLY — 47 items
ADH SKN CLS APL DERMABOND .7 (GAUZE/BANDAGES/DRESSINGS) ×1
APL PRP STRL LF DISP 70% ISPRP (MISCELLANEOUS) ×1
BAG COUNTER SPONGE SURGICOUNT (BAG) ×2 IMPLANT
BAG SPNG CNTER NS LX DISP (BAG) ×1
CANISTER SUCT 3000ML PPV (MISCELLANEOUS) ×2 IMPLANT
CHLORAPREP W/TINT 26 (MISCELLANEOUS) ×2 IMPLANT
CNTNR URN SCR LID CUP LEK RST (MISCELLANEOUS) IMPLANT
CONT SPEC 4OZ STRL OR WHT (MISCELLANEOUS) ×4
COVER SURGICAL LIGHT HANDLE (MISCELLANEOUS) ×2 IMPLANT
DERMABOND ADVANCED (GAUZE/BANDAGES/DRESSINGS) ×1
DERMABOND ADVANCED .7 DNX12 (GAUZE/BANDAGES/DRESSINGS) ×1 IMPLANT
DRAPE LAPAROSCOPIC ABDOMINAL (DRAPES) IMPLANT
DRAPE LAPAROTOMY 100X72 PEDS (DRAPES) IMPLANT
DRAPE LAPAROTOMY T 98X78 PEDS (DRAPES) ×2 IMPLANT
DRSG TEGADERM 4X4.75 (GAUZE/BANDAGES/DRESSINGS) IMPLANT
ELECT REM PT RETURN 9FT ADLT (ELECTROSURGICAL) ×2
ELECTRODE REM PT RTRN 9FT ADLT (ELECTROSURGICAL) ×1 IMPLANT
GAUZE 4X4 16PLY ~~LOC~~+RFID DBL (SPONGE) ×2 IMPLANT
GAUZE SPONGE 4X4 12PLY STRL (GAUZE/BANDAGES/DRESSINGS) ×2 IMPLANT
GLOVE SURG POLY MICRO LF SZ5.5 (GLOVE) ×2 IMPLANT
GLOVE SURG UNDER POLY LF SZ6 (GLOVE) ×2 IMPLANT
GOWN STRL REUS W/ TWL LRG LVL3 (GOWN DISPOSABLE) ×2 IMPLANT
GOWN STRL REUS W/TWL LRG LVL3 (GOWN DISPOSABLE) ×4
KIT BASIN OR (CUSTOM PROCEDURE TRAY) ×2 IMPLANT
KIT TURNOVER KIT B (KITS) ×2 IMPLANT
NDL HYPO 25GX1X1/2 BEV (NEEDLE) ×1 IMPLANT
NEEDLE 22X1 1/2 (OR ONLY) (NEEDLE) ×2 IMPLANT
NEEDLE HYPO 25GX1X1/2 BEV (NEEDLE) ×2 IMPLANT
NS IRRIG 1000ML POUR BTL (IV SOLUTION) ×2 IMPLANT
PACK GENERAL/GYN (CUSTOM PROCEDURE TRAY) ×2 IMPLANT
PAD ARMBOARD 7.5X6 YLW CONV (MISCELLANEOUS) ×4 IMPLANT
PENCIL SMOKE EVACUATOR (MISCELLANEOUS) ×2 IMPLANT
SPECIMEN JAR SMALL (MISCELLANEOUS) ×2 IMPLANT
SPONGE SURGIFOAM ABS GEL 100 (HEMOSTASIS) IMPLANT
SPONGE T-LAP 4X18 ~~LOC~~+RFID (SPONGE) IMPLANT
STRIP CLOSURE SKIN 1/2X4 (GAUZE/BANDAGES/DRESSINGS) IMPLANT
SURGILUBE 2OZ TUBE FLIPTOP (MISCELLANEOUS) ×2 IMPLANT
SUT CHROMIC 2 0 SH (SUTURE) ×4 IMPLANT
SUT CHROMIC 3 0 PS 2 (SUTURE) ×1 IMPLANT
SUT MON AB 4-0 PC3 18 (SUTURE) ×2 IMPLANT
SUT SILK 2 0 PERMA HAND 18 BK (SUTURE) IMPLANT
SUT VIC AB 3-0 SH 18 (SUTURE) IMPLANT
SUT VIC AB 3-0 SH 27 (SUTURE) ×4
SUT VIC AB 3-0 SH 27X BRD (SUTURE) ×1 IMPLANT
SYR CONTROL 10ML LL (SYRINGE) ×2 IMPLANT
TOWEL GREEN STERILE (TOWEL DISPOSABLE) ×2 IMPLANT
TOWEL GREEN STERILE FF (TOWEL DISPOSABLE) ×4 IMPLANT

## 2020-12-03 NOTE — Discharge Instructions (Addendum)
[facility] DISCHARGE INSTRUCTIONS  Activity Ok to shower in 24 hours, but do not bathe or submerge incisions underwater. Do not drive while taking narcotic pain medication.  Wound Care Your incision on your back is covered with skin glue called Dermabond. This will peel off on its own over time. You may shower and allow warm soapy water to run over your back incision. Gently pat dry. Do not submerge your back incision underwater. The incision near your anus has sutures. The incision may drain a little, and this is normal. You may keep some gauze over this for any drainage. Sit in a few inches of warm water for for 10-15 minutes after bowel movements to keep this incision clean. The sutures will dissolve and fall out over time. Monitor your incisions for any new redness, tenderness, or drainage.  When to Call us: Fever greater than 100.5 New redness, drainage, or swelling at incision site Severe pain, nausea, or vomiting  Follow-up You have an appointment scheduled with Dr. Zenia Resides on December 21, 2020 at 10:00am. This will be at the [facility] Surgery office at [address], [place], [place], [place]. Please arrive at least 15 minutes prior to your scheduled appointment time.  For questions or concerns, please call the office at [phone].

## 2020-12-03 NOTE — Anesthesia Preprocedure Evaluation (Addendum)
Anesthesia Evaluation  Patient identified by MRN, date of birth, ID band Patient awake    Reviewed: Allergy & Precautions, NPO status , Patient's Chart, lab work & pertinent test results  History of Anesthesia Complications Negative for: history of anesthetic complications  Airway Mallampati: II  TM Distance: >3 FB Neck ROM: Full    Dental  (+) Implants, Dental Advisory Given, Teeth Intact   Pulmonary COPD, Current Smoker and Patient abstained from smoking.,    Pulmonary exam normal        Cardiovascular hypertension, Normal cardiovascular exam     Neuro/Psych S/p coiling of R MCA aneurysm    GI/Hepatic GERD  ,(+) Hepatitis -, C  Endo/Other  diabetes, Type 2  Renal/GU negative Renal ROS  negative genitourinary   Musculoskeletal  (+) Arthritis ,   Abdominal   Peds  Hematology negative hematology ROS (+)   Anesthesia Other Findings   Reproductive/Obstetrics                            Anesthesia Physical Anesthesia Plan  ASA: 2  Anesthesia Plan: General   Post-op Pain Management:    Induction: Intravenous  PONV Risk Score and Plan: 1 and Ondansetron, Dexamethasone, Treatment may vary due to age or medical condition and Midazolam  Airway Management Planned: Oral ETT  Additional Equipment: None  Intra-op Plan:   Post-operative Plan: Extubation in OR  Informed Consent: I have reviewed the patients History and Physical, chart, labs and discussed the procedure including the risks, benefits and alternatives for the proposed anesthesia with the patient or authorized representative who has indicated his/her understanding and acceptance.     Dental advisory given  Plan Discussed with:   Anesthesia Plan Comments:        Anesthesia Quick Evaluation

## 2020-12-03 NOTE — Transfer of Care (Signed)
Immediate Anesthesia Transfer of Care Note  Patient: Dimetri Armitage  Procedure(s) Performed: EXCISION BACK LIPOMA EXCISION GLUTEAL SEBACEOUS CYST RECTAL EXAM UNDER ANESTHESIA  Patient Location: PACU  Anesthesia Type:General  Level of Consciousness: awake and drowsy  Airway & Oxygen Therapy: Patient Spontanous Breathing and Patient connected to nasal cannula oxygen  Post-op Assessment: Report given to RN and Post -op Vital signs reviewed and stable  Post vital signs: Reviewed and stable  Last Vitals:  Vitals Value Taken Time  BP 138/95 12/03/20 1331  Temp    Pulse 95 12/03/20 1333  Resp 14 12/03/20 1333  SpO2 100 % 12/03/20 1333  Vitals shown include unvalidated device data.  Last Pain:  Vitals:   12/03/20 1135  PainSc: 0-No pain         Complications: No notable events documented.

## 2020-12-03 NOTE — H&P (Signed)
Tommy Rojas is an 65 y.o. male.   Chief Complaint: back mass, gluteal cyst HPI: Tommy Rojas is a 64 yo male who was referred with a lipoma on the back and a gluteal cyst. The back lipoma has been present for several years and causes him discomfort. He has had an area of drainage on the left gluteus for several months. Since his clinic visit with me he reports itching in this area but no recent drainage. He presents today for surgical excision of both lesions.  Past Medical History:  Diagnosis Date   Acne    BACK OF HEAD    Arthritis    LUMBAR   Chronic low back pain    Congenital fusion of cervical spine    C2-3   COPD (chronic obstructive pulmonary disease) (HCC)    MILD   Dyspnea    on exertion   Dyspnea on exertion    Enlarged prostate with lower urinary tract symptoms (LUTS)    GERD (gastroesophageal reflux disease)    History of acute pancreatitis    History of colonic diverticulitis    2 episodes in past   History of positive hepatitis C    1990s completed treatment   History of urinary retention    Hyperlipidemia    Hypertension    Penile lesion    S/P coil embolization of cerebral aneurysm 04-08-2012    dr s. Estanislado Pandy   right MCA aneursym   Type 2 diabetes mellitus [facility])     Past Surgical History:  Procedure Laterality Date   ANKLE SURGERY Left 2008 approx.   tendon repair  poss. via arthroscopy   ANTERIOR CERVICAL DECOMP/DISCECTOMY FUSION  2005   C5-6 with bone graft   CATARACT EXTRACTION W/ INTRAOCULAR LENS  IMPLANT, BILATERAL  2016   CEREBRAL ARTERIORGRAM , FOUR VESSEL  03-12-2012    dr s. Estanislado Pandy   right MCA aneursym   LAPAROSCOPIC CHOLECYSTECTOMY  1990s   MEATAL / URETHRAL REPAIR  age 12   PENILE BIOPSY N/A 01/01/2017   Procedure: PENILE BIOPSY;  Surgeon: Cleon Gustin, MD;  Location: [facility];  Service: Urology;  Laterality: N/A;   PENILE BIOPSY N/A 08/02/2018   Procedure: PENILE BIOPSY;  Surgeon: Cleon Gustin, MD;   Location: [facility];  Service: Urology;  Laterality: N/A;  [place] N/A 04/08/2012   Procedure: RADIOLOGY WITH ANESTHESIA;  Surgeon: Rob Hickman, MD;  Location: [facility];  Service: Radiology;  Laterality: N/A;   Right common carotid anteriogram w/ right MCA coiling aneursym   ROTATOR CUFF REPAIR Right 2008 approx.   WRIST SURGERY Right 1979   ORIF     History reviewed. No pertinent family history. Social History:  reports that he has been smoking cigarettes. He has a 40.00 pack-year smoking history. He uses smokeless tobacco. He reports current alcohol use. He reports that he does not use drugs.  Allergies: No Known Allergies  Medications Prior to Admission  Medication Sig Dispense Refill   aspirin EC 81 MG tablet Take 81 mg every morning by mouth.      Cholecalciferol (VITAMIN D) 50 MCG (2000 UT) CAPS Take 2,000 Units by mouth daily.     clindamycin (CLEOCIN T) 1 % external solution Apply 1 application topically daily as needed (rash).     DULoxetine (CYMBALTA) 60 MG capsule Take 60 mg every morning by mouth.      empagliflozin (JARDIANCE) 10 MG TABS tablet Take 10 mg by  mouth daily.     HYDROcodone-acetaminophen (NORCO) 7.5-325 MG tablet Take 1 tablet by mouth every 6 (six) hours as needed for pain.     ketoconazole (NIZORAL) 2 % shampoo Apply 1 application topically daily as needed for irritation.     lisinopril (PRINIVIL,ZESTRIL) 10 MG tablet Take 10 mg by mouth every morning.      pravastatin (PRAVACHOL) 10 MG tablet Take 10 mg every morning by mouth.     tamsulosin (FLOMAX) 0.4 MG CAPS capsule Take 0.4 mg by mouth daily.      cyclobenzaprine (FLEXERIL) 5 MG tablet Take 5 mg by mouth 2 (two) times daily as needed for muscle spasms.      Results for orders placed or performed during the hospital encounter of 12/03/20 (from the past 48 hour(s))  Glucose, capillary     Status: Abnormal   Collection Time: 12/03/20 10:27 AM  Result Value Ref Range    Glucose-Capillary 161 (H) 70 - 99 mg/dL    Comment: Glucose reference range applies only to samples taken after fasting for at least 8 hours.   No results found.  Review of Systems  Constitutional:  Negative for chills and fever.  Respiratory:  Negative for shortness of breath and stridor.   Gastrointestinal:  Negative for abdominal pain.  Allergic/Immunologic: Negative for immunocompromised state.  Neurological:  Negative for syncope and weakness.  Psychiatric/Behavioral:  Negative for agitation and confusion.    Blood pressure (!) 145/92, pulse 77, temperature 98.1 F (36.7 C), resp. rate 20, height 5\' 11"  (1.803 m), weight 80.7 kg, SpO2 98 %. Physical Exam Constitutional:      General: He is not in acute distress.    Appearance: Normal appearance.  HENT:     Head: Normocephalic and atraumatic.  Eyes:     General: No scleral icterus.    Conjunctiva/sclera: Conjunctivae normal.  Pulmonary:     Effort: Pulmonary effort is normal. No respiratory distress.  Genitourinary:    Comments: Small pore on the left medial gluteus, no drainage or fluctuance. Musculoskeletal:        General: No swelling. Normal range of motion.     Cervical back: Normal range of motion.     Comments: Soft subcutaneous mass on left posterior shoulder on the superior border of the scapula, mobile and well-circumscribed, consistent with a lipoma. Approximately 4cm in diameter.   Neurological:     General: No focal deficit present.     Mental Status: He is alert and oriented to person, place, and time.  Psychiatric:        Mood and Affect: Mood normal.        Behavior: Behavior normal.     Assessment/Plan 65 yo male with lipoma on back, and likely sebaceous cyst on left gluteus. Proceed to OR for excision of lipoma, followed by rectal EUA to rule out underlying fistula, with excision of gluteal cyst. Informed consent obtained. Plan for discharge home from PACU.  Dwan Bolt, MD 12/03/2020, 11:36  AM

## 2020-12-03 NOTE — Op Note (Signed)
Date: 12/03/20  Patient: Tommy Rojas MRN: [id number]  Preoperative Diagnosis: Lipoma upper back, left gluteal inclusion cyst Postoperative Diagnosis: Lipoma upper back, chronic wound of left gluteus  Procedure:  Excision of lipoma from left posterior shoulder Rectal exam under anesthesia, excision of left gluteal wound  Surgeon: Michaelle Birks, MD  EBL: Minimal  Anesthesia: General  Specimens:  Left upper back mass Left perianal wound  Indications: Tommy Rojas is a 65 year old male who presented with a mass consistent with a lipoma on the back of his left shoulder, as well as a draining wound on the left gluteus near the anal verge which had been present for several months.  Both lesions were symptomatic and he desired excision.  He has no known personal or family history of IBD and has recently had a colonoscopy.  Findings: Well-circumscribed fatty mass consistent with a lipoma measuring approximately 4 cm excised from the left upper back.  Sinus tract limited to the subcutaneous tissue on the left gluteus, with no communication with the anal canal or rectum.  Procedure details: Informed consent was obtained in the preoperative area prior to the procedure. The patient was brought to the operating room, general anesthesia was induced, and appropriate lines and drains were placed for intraoperative monitoring.  The patient was placed in the prone position and perioperative antibiotics were administered per SCIP guidelines. The the upper back was prepped with ChloraPrep, the perianal area was prepped with Betadine, and both areas were separately draped in the usual sterile fashion.. A pre-procedure timeout was taken verifying patient identity, surgical site and procedure to be performed.  The lipoma was approached first.  A transverse skin incision was made over the palpable mass overlying the upper border of the left scapula.  Subcutaneous tissue was divided with cautery and a  well-circumscribed fatty mass was encountered.  The mass was dissected out from the surrounding subcutaneous tissue and off the muscle fascia using blunt dissection and cautery.  The mass was excised and measured approximately 4 cm in diameter, and was sent for routine pathology.  The wound was irrigated and hemostasis was achieved using cautery.  The skin was reapproximated with a layer of deep dermal simple interrupted 3-0 Vicryl sutures.  The skin was closed with a running subcuticular 4-0 Monocryl suture, and Dermabond was applied.  Next the perianal wound was examined.  There appeared to be 2 very small skin pores approximately 2 cm apart, with minimal drainage, on the inferior left gluteus near the anal verge.  A digital rectal exam was performed and there were no palpable masses or abnormalities.  A retractor was inserted into the anal canal and there were no visible abnormalities.  A lacrimal duct probe was used to probe each of the two skin openings on the left gluteus.  These 2 pores were connected by a superficial subcutaneous sinus tract, but on probing of both openings there was no extension any deeper into the subcutaneous tissue or towards the anal canal or rectum.  This appeared to be a chronically draining sinus tract that was entirely limited to the skin and superficial subcutaneous tissue.  Thus the decision was made to proceed with complete excision of the wound.  An elliptical skin incision was made around the sinus tract.  The subcutaneous tissue was divided with cautery and the area was completely excised and sent for routine pathology.  The surrounding subcutaneous tissue was normal in appearance with no further visible sinus tracts.  Hemostasis was achieved with cautery.  The deep dermal layer was closed with simple interrupted 3-0 Vicryl sutures.  The skin was closed with simple interrupted 3-0 chromic sutures.  A clean gauze dressing was applied.  The patient tolerated the procedure  with no apparent complications.  All counts were correct x2 at the end of the procedure. The patient was extubated and taken to PACU in stable condition.  Michaelle Birks, MD 12/03/20 1:34 PM

## 2020-12-03 NOTE — Anesthesia Postprocedure Evaluation (Signed)
Anesthesia Post Note  Patient: Tommy Rojas  Procedure(s) Performed: EXCISION BACK LIPOMA EXCISION GLUTEAL SEBACEOUS CYST RECTAL EXAM UNDER ANESTHESIA     Patient location during evaluation: PACU Anesthesia Type: General Level of consciousness: awake and alert Pain management: pain level controlled Vital Signs Assessment: post-procedure vital signs reviewed and stable Respiratory status: spontaneous breathing, nonlabored ventilation and respiratory function stable Cardiovascular status: blood pressure returned to baseline and stable Postop Assessment: no apparent nausea or vomiting Anesthetic complications: no   No notable events documented.  Last Vitals:  Vitals:   12/03/20 1345 12/03/20 1400  BP: 131/79 134/80  Pulse: 80 81  Resp: 13 15  Temp:    SpO2: 93% 94%    Last Pain:  Vitals:   12/03/20 1359  PainSc: 0-No pain                 Lidia Collum

## 2020-12-03 NOTE — Anesthesia Procedure Notes (Addendum)
Procedure Name: Intubation Date/Time: 12/03/2020 12:16 PM Performed by: Vonna Drafts, CRNA Pre-anesthesia Checklist: Patient identified, Emergency Drugs available, Suction available and Patient being monitored Patient Re-evaluated:Patient Re-evaluated prior to induction Oxygen Delivery Method: Circle system utilized Preoxygenation: Pre-oxygenation with 100% oxygen Induction Type: IV induction Ventilation: Two handed mask ventilation required and Oral airway inserted - appropriate to patient size Laryngoscope Size: Mac and 3 Grade View: Grade I Tube type: Oral Tube size: 7.5 mm Number of attempts: 1 Airway Equipment and Method: Stylet and Oral airway Placement Confirmation: ETT inserted through vocal cords under direct vision, positive ETCO2 and breath sounds checked- equal and bilateral Secured at: 23 cm Tube secured with: Tape Dental Injury: Teeth and Oropharynx as per pre-operative assessment

## 2020-12-04 ENCOUNTER — Encounter (HOSPITAL_COMMUNITY): Payer: Self-pay | Admitting: Surgery

## 2020-12-06 LAB — SURGICAL PATHOLOGY

## 2020-12-30 DIAGNOSIS — H43812 Vitreous degeneration, left eye: Secondary | ICD-10-CM | POA: Diagnosis not present

## 2020-12-30 DIAGNOSIS — Z961 Presence of intraocular lens: Secondary | ICD-10-CM | POA: Diagnosis not present

## 2020-12-30 DIAGNOSIS — H0102A Squamous blepharitis right eye, upper and lower eyelids: Secondary | ICD-10-CM | POA: Diagnosis not present

## 2020-12-30 DIAGNOSIS — E119 Type 2 diabetes mellitus without complications: Secondary | ICD-10-CM | POA: Diagnosis not present

## 2021-02-03 DIAGNOSIS — Z03818 Encounter for observation for suspected exposure to other biological agents ruled out: Secondary | ICD-10-CM | POA: Diagnosis not present

## 2021-02-03 DIAGNOSIS — J101 Influenza due to other identified influenza virus with other respiratory manifestations: Secondary | ICD-10-CM | POA: Diagnosis not present

## 2021-02-03 DIAGNOSIS — R059 Cough, unspecified: Secondary | ICD-10-CM | POA: Diagnosis not present

## 2021-05-13 DIAGNOSIS — E1122 Type 2 diabetes mellitus with diabetic chronic kidney disease: Secondary | ICD-10-CM | POA: Diagnosis not present

## 2021-05-13 DIAGNOSIS — Z79899 Other long term (current) drug therapy: Secondary | ICD-10-CM | POA: Diagnosis not present

## 2021-05-13 DIAGNOSIS — M549 Dorsalgia, unspecified: Secondary | ICD-10-CM | POA: Diagnosis not present

## 2021-05-13 DIAGNOSIS — G894 Chronic pain syndrome: Secondary | ICD-10-CM | POA: Diagnosis not present

## 2021-10-21 DIAGNOSIS — E1122 Type 2 diabetes mellitus with diabetic chronic kidney disease: Secondary | ICD-10-CM | POA: Diagnosis not present

## 2021-10-21 DIAGNOSIS — Z125 Encounter for screening for malignant neoplasm of prostate: Secondary | ICD-10-CM | POA: Diagnosis not present

## 2021-10-21 DIAGNOSIS — Z0000 Encounter for general adult medical examination without abnormal findings: Secondary | ICD-10-CM | POA: Diagnosis not present

## 2021-10-21 DIAGNOSIS — Z13220 Encounter for screening for lipoid disorders: Secondary | ICD-10-CM | POA: Diagnosis not present

## 2021-10-28 DIAGNOSIS — N183 Chronic kidney disease, stage 3 unspecified: Secondary | ICD-10-CM | POA: Diagnosis not present

## 2021-10-28 DIAGNOSIS — I10 Essential (primary) hypertension: Secondary | ICD-10-CM | POA: Diagnosis not present

## 2021-10-28 DIAGNOSIS — Z23 Encounter for immunization: Secondary | ICD-10-CM | POA: Diagnosis not present

## 2021-10-28 DIAGNOSIS — E1122 Type 2 diabetes mellitus with diabetic chronic kidney disease: Secondary | ICD-10-CM | POA: Diagnosis not present

## 2021-10-28 DIAGNOSIS — M549 Dorsalgia, unspecified: Secondary | ICD-10-CM | POA: Diagnosis not present

## 2021-10-28 DIAGNOSIS — Z0000 Encounter for general adult medical examination without abnormal findings: Secondary | ICD-10-CM | POA: Diagnosis not present

## 2022-01-04 DIAGNOSIS — Z961 Presence of intraocular lens: Secondary | ICD-10-CM | POA: Diagnosis not present

## 2022-01-04 DIAGNOSIS — H0102B Squamous blepharitis left eye, upper and lower eyelids: Secondary | ICD-10-CM | POA: Diagnosis not present

## 2022-01-04 DIAGNOSIS — H0102A Squamous blepharitis right eye, upper and lower eyelids: Secondary | ICD-10-CM | POA: Diagnosis not present

## 2022-01-04 DIAGNOSIS — E119 Type 2 diabetes mellitus without complications: Secondary | ICD-10-CM | POA: Diagnosis not present

## 2022-01-10 ENCOUNTER — Telehealth: Payer: Self-pay

## 2022-01-10 NOTE — Patient Outreach (Signed)
  Care Coordination   01/10/2022 Name: Mansour Balboa MRN: [id number] DOB: [birth year]   Care Coordination Outreach Attempts:  An unsuccessful telephone outreach was attempted today to offer the patient information about available care coordination services as a benefit of their health plan.   Follow Up Plan:  Additional outreach attempts will be made to offer the patient care coordination information and services.   Encounter Outcome:  No Answer  Care Coordination Interventions Activated:  No   Care Coordination Interventions:  No, not indicated    Peter Garter RN, BSN,CCM, [place] [facility] [phone]

## 2022-01-26 ENCOUNTER — Telehealth: Payer: Self-pay

## 2022-01-26 NOTE — Patient Outreach (Signed)
  Care Coordination   01/26/2022 Name: Tommy Rojas MRN: [id number] DOB: 1955-04-16   Care Coordination Outreach Attempts:  A second unsuccessful outreach was attempted today to offer the patient with information about available care coordination services as a benefit of their health plan.     Follow Up Plan:  Additional outreach attempts will be made to offer the patient care coordination information and services.   Encounter Outcome:  No Answer   Care Coordination Interventions:  No, not indicated    Peter Garter RN, BSN,CCM, CDE Care Management Coordinator [facility] [phone]

## 2022-02-02 ENCOUNTER — Telehealth: Payer: Self-pay

## 2022-02-02 NOTE — Patient Outreach (Signed)
  Care Coordination   02/02/2022 Name: Tommy Rojas MRN: [id number] DOB: Apr 20, 1955   Care Coordination Outreach Attempts:  A third unsuccessful outreach was attempted today to offer the patient with information about available care coordination services as a benefit of their health plan.   Follow Up Plan:  No further outreach attempts will be made at this time. We have been unable to contact the patient to offer or enroll patient in care coordination services  Encounter Outcome:  No Answer   Care Coordination Interventions:  No, not indicated    Peter Garter RN, BSN,CCM, [facility] [phone]

## 2022-04-28 DIAGNOSIS — G894 Chronic pain syndrome: Secondary | ICD-10-CM | POA: Diagnosis not present

## 2022-04-28 DIAGNOSIS — M545 Low back pain, unspecified: Secondary | ICD-10-CM | POA: Diagnosis not present

## 2022-04-28 DIAGNOSIS — E1122 Type 2 diabetes mellitus with diabetic chronic kidney disease: Secondary | ICD-10-CM | POA: Diagnosis not present

## 2022-04-28 DIAGNOSIS — E782 Mixed hyperlipidemia: Secondary | ICD-10-CM | POA: Diagnosis not present

## 2022-04-28 DIAGNOSIS — Z79899 Other long term (current) drug therapy: Secondary | ICD-10-CM | POA: Diagnosis not present

## 2022-11-08 DIAGNOSIS — E1122 Type 2 diabetes mellitus with diabetic chronic kidney disease: Secondary | ICD-10-CM | POA: Diagnosis not present

## 2022-11-08 DIAGNOSIS — E782 Mixed hyperlipidemia: Secondary | ICD-10-CM | POA: Diagnosis not present

## 2022-11-08 DIAGNOSIS — Z79899 Other long term (current) drug therapy: Secondary | ICD-10-CM | POA: Diagnosis not present

## 2022-11-30 DIAGNOSIS — Z23 Encounter for immunization: Secondary | ICD-10-CM | POA: Diagnosis not present

## 2022-11-30 DIAGNOSIS — Z0000 Encounter for general adult medical examination without abnormal findings: Secondary | ICD-10-CM | POA: Diagnosis not present

## 2022-11-30 DIAGNOSIS — L729 Follicular cyst of the skin and subcutaneous tissue, unspecified: Secondary | ICD-10-CM | POA: Diagnosis not present

## 2022-11-30 DIAGNOSIS — E782 Mixed hyperlipidemia: Secondary | ICD-10-CM | POA: Diagnosis not present

## 2022-11-30 DIAGNOSIS — E1122 Type 2 diabetes mellitus with diabetic chronic kidney disease: Secondary | ICD-10-CM | POA: Diagnosis not present

## 2022-11-30 DIAGNOSIS — I10 Essential (primary) hypertension: Secondary | ICD-10-CM | POA: Diagnosis not present

## 2022-11-30 DIAGNOSIS — I671 Cerebral aneurysm, nonruptured: Secondary | ICD-10-CM | POA: Diagnosis not present

## 2022-11-30 DIAGNOSIS — G894 Chronic pain syndrome: Secondary | ICD-10-CM | POA: Diagnosis not present

## 2022-12-01 ENCOUNTER — Other Ambulatory Visit: Payer: Self-pay | Admitting: Family Medicine

## 2022-12-01 DIAGNOSIS — Z122 Encounter for screening for malignant neoplasm of respiratory organs: Secondary | ICD-10-CM

## 2022-12-01 DIAGNOSIS — F17200 Nicotine dependence, unspecified, uncomplicated: Secondary | ICD-10-CM

## 2023-01-02 ENCOUNTER — Ambulatory Visit
Admission: RE | Admit: 2023-01-02 | Discharge: 2023-01-02 | Disposition: A | Discharge status: Home / Self Care | Payer: Medicare Other | Source: Ambulatory Visit | Attending: Family Medicine | Admitting: Family Medicine

## 2023-01-02 DIAGNOSIS — F17210 Nicotine dependence, cigarettes, uncomplicated: Secondary | ICD-10-CM | POA: Diagnosis not present

## 2023-01-02 DIAGNOSIS — Z122 Encounter for screening for malignant neoplasm of respiratory organs: Secondary | ICD-10-CM

## 2023-01-02 DIAGNOSIS — F17200 Nicotine dependence, unspecified, uncomplicated: Secondary | ICD-10-CM

## 2023-01-16 DIAGNOSIS — M549 Dorsalgia, unspecified: Secondary | ICD-10-CM | POA: Diagnosis not present

## 2023-01-16 DIAGNOSIS — G894 Chronic pain syndrome: Secondary | ICD-10-CM | POA: Diagnosis not present

## 2023-01-16 DIAGNOSIS — F17200 Nicotine dependence, unspecified, uncomplicated: Secondary | ICD-10-CM | POA: Diagnosis not present

## 2023-02-05 DIAGNOSIS — R918 Other nonspecific abnormal finding of lung field: Secondary | ICD-10-CM | POA: Diagnosis not present

## 2023-02-05 DIAGNOSIS — R053 Chronic cough: Secondary | ICD-10-CM | POA: Diagnosis not present

## 2023-05-01 DIAGNOSIS — B351 Tinea unguium: Secondary | ICD-10-CM | POA: Diagnosis not present

## 2023-05-01 DIAGNOSIS — L609 Nail disorder, unspecified: Secondary | ICD-10-CM | POA: Diagnosis not present

## 2023-05-15 DIAGNOSIS — E119 Type 2 diabetes mellitus without complications: Secondary | ICD-10-CM | POA: Diagnosis not present

## 2023-05-15 DIAGNOSIS — H47323 Drusen of optic disc, bilateral: Secondary | ICD-10-CM | POA: Diagnosis not present

## 2023-05-15 DIAGNOSIS — H0102B Squamous blepharitis left eye, upper and lower eyelids: Secondary | ICD-10-CM | POA: Diagnosis not present

## 2023-05-15 DIAGNOSIS — H0102A Squamous blepharitis right eye, upper and lower eyelids: Secondary | ICD-10-CM | POA: Diagnosis not present

## 2023-05-15 DIAGNOSIS — H43812 Vitreous degeneration, left eye: Secondary | ICD-10-CM | POA: Diagnosis not present

## 2023-06-26 ENCOUNTER — Other Ambulatory Visit: Payer: Self-pay | Admitting: Family Medicine

## 2023-06-26 DIAGNOSIS — R918 Other nonspecific abnormal finding of lung field: Secondary | ICD-10-CM

## 2023-09-18 ENCOUNTER — Other Ambulatory Visit: Payer: Self-pay | Admitting: Family Medicine

## 2023-09-18 DIAGNOSIS — R918 Other nonspecific abnormal finding of lung field: Secondary | ICD-10-CM

## 2023-09-26 ENCOUNTER — Ambulatory Visit
Admission: RE | Admit: 2023-09-26 | Discharge: 2023-09-26 | Disposition: A | Discharge status: Home / Self Care | Source: Ambulatory Visit | Attending: Family Medicine | Admitting: Family Medicine

## 2023-09-26 DIAGNOSIS — R918 Other nonspecific abnormal finding of lung field: Secondary | ICD-10-CM

## 2023-11-05 DIAGNOSIS — L0291 Cutaneous abscess, unspecified: Secondary | ICD-10-CM | POA: Diagnosis not present

## 2023-11-05 DIAGNOSIS — L814 Other melanin hyperpigmentation: Secondary | ICD-10-CM | POA: Diagnosis not present

## 2023-11-05 DIAGNOSIS — D225 Melanocytic nevi of trunk: Secondary | ICD-10-CM | POA: Diagnosis not present

## 2023-11-05 DIAGNOSIS — D239 Other benign neoplasm of skin, unspecified: Secondary | ICD-10-CM | POA: Diagnosis not present

## 2023-11-05 DIAGNOSIS — L821 Other seborrheic keratosis: Secondary | ICD-10-CM | POA: Diagnosis not present

## 2023-11-05 DIAGNOSIS — L578 Other skin changes due to chronic exposure to nonionizing radiation: Secondary | ICD-10-CM | POA: Diagnosis not present

## 2023-12-03 DIAGNOSIS — G894 Chronic pain syndrome: Secondary | ICD-10-CM | POA: Diagnosis not present

## 2023-12-03 DIAGNOSIS — E1122 Type 2 diabetes mellitus with diabetic chronic kidney disease: Secondary | ICD-10-CM | POA: Diagnosis not present

## 2023-12-03 DIAGNOSIS — F17210 Nicotine dependence, cigarettes, uncomplicated: Secondary | ICD-10-CM | POA: Diagnosis not present

## 2023-12-03 DIAGNOSIS — I10 Essential (primary) hypertension: Secondary | ICD-10-CM | POA: Diagnosis not present

## 2023-12-03 DIAGNOSIS — L0291 Cutaneous abscess, unspecified: Secondary | ICD-10-CM | POA: Diagnosis not present

## 2023-12-03 DIAGNOSIS — E782 Mixed hyperlipidemia: Secondary | ICD-10-CM | POA: Diagnosis not present

## 2023-12-03 DIAGNOSIS — Z23 Encounter for immunization: Secondary | ICD-10-CM | POA: Diagnosis not present

## 2023-12-03 DIAGNOSIS — Z0000 Encounter for general adult medical examination without abnormal findings: Secondary | ICD-10-CM | POA: Diagnosis not present
